# Patient Record
Sex: Male | Born: 1965
Health system: Southern US, Community
[De-identification: ages and names within clinical notes are randomized; demographics above are authoritative.]

## PROBLEM LIST (undated history)

## (undated) DIAGNOSIS — R569 Unspecified convulsions: Secondary | ICD-10-CM

## (undated) DIAGNOSIS — R630 Anorexia: Secondary | ICD-10-CM

## (undated) DIAGNOSIS — I2510 Atherosclerotic heart disease of native coronary artery without angina pectoris: Secondary | ICD-10-CM

## (undated) DIAGNOSIS — I4891 Unspecified atrial fibrillation: Secondary | ICD-10-CM

## (undated) DIAGNOSIS — R000 Tachycardia, unspecified: Secondary | ICD-10-CM

## (undated) DIAGNOSIS — E0590 Thyrotoxicosis, unspecified without thyrotoxic crisis or storm: Secondary | ICD-10-CM

## (undated) DIAGNOSIS — IMO0002 Reserved for concepts with insufficient information to code with codable children: Secondary | ICD-10-CM

## (undated) DIAGNOSIS — R001 Bradycardia, unspecified: Secondary | ICD-10-CM

## (undated) DIAGNOSIS — I4892 Unspecified atrial flutter: Secondary | ICD-10-CM

## (undated) DIAGNOSIS — R002 Palpitations: Secondary | ICD-10-CM

## (undated) DIAGNOSIS — F319 Bipolar disorder, unspecified: Secondary | ICD-10-CM

## (undated) DIAGNOSIS — M549 Dorsalgia, unspecified: Secondary | ICD-10-CM

## (undated) HISTORY — DX: Unspecified atrial fibrillation: I48.91

## (undated) HISTORY — DX: Dorsalgia, unspecified: M54.9

## (undated) HISTORY — DX: Unspecified atrial flutter: I48.92

## (undated) HISTORY — DX: Anorexia: R63.0

## (undated) HISTORY — DX: Bipolar disorder, unspecified: F31.9

## (undated) HISTORY — DX: Thyrotoxicosis, unspecified without thyrotoxic crisis or storm: E05.90

## (undated) HISTORY — DX: Tachycardia, unspecified: R00.0

## (undated) HISTORY — DX: Palpitations: R00.2

## (undated) HISTORY — DX: Reserved for concepts with insufficient information to code with codable children: IMO0002

---

## 2003-10-15 ENCOUNTER — Emergency Department (HOSPITAL_COMMUNITY): Admission: AD | Admit: 2003-10-15 | Discharge: 2003-10-15 | Payer: Self-pay | Admitting: Family Medicine

## 2003-12-03 ENCOUNTER — Emergency Department (HOSPITAL_COMMUNITY): Admission: EM | Admit: 2003-12-03 | Discharge: 2003-12-03 | Payer: Self-pay | Admitting: Emergency Medicine

## 2004-02-20 ENCOUNTER — Emergency Department (HOSPITAL_COMMUNITY): Admission: EM | Admit: 2004-02-20 | Discharge: 2004-02-20 | Payer: Self-pay | Admitting: Family Medicine

## 2004-03-10 ENCOUNTER — Emergency Department (HOSPITAL_COMMUNITY): Admission: EM | Admit: 2004-03-10 | Discharge: 2004-03-10 | Payer: Self-pay | Admitting: Family Medicine

## 2004-12-23 ENCOUNTER — Emergency Department (HOSPITAL_COMMUNITY): Admission: EM | Admit: 2004-12-23 | Discharge: 2004-12-23 | Payer: Self-pay | Admitting: Family Medicine

## 2005-02-14 ENCOUNTER — Emergency Department (HOSPITAL_COMMUNITY): Admission: EM | Admit: 2005-02-14 | Discharge: 2005-02-14 | Payer: Self-pay | Admitting: Family Medicine

## 2005-07-14 ENCOUNTER — Emergency Department (HOSPITAL_COMMUNITY): Admission: EM | Admit: 2005-07-14 | Discharge: 2005-07-14 | Payer: Self-pay | Admitting: Family Medicine

## 2005-07-17 ENCOUNTER — Emergency Department (HOSPITAL_COMMUNITY): Admission: EM | Admit: 2005-07-17 | Discharge: 2005-07-17 | Payer: Self-pay | Admitting: Family Medicine

## 2005-09-15 ENCOUNTER — Emergency Department (HOSPITAL_COMMUNITY): Admission: EM | Admit: 2005-09-15 | Discharge: 2005-09-15 | Payer: Self-pay | Admitting: Emergency Medicine

## 2006-12-30 ENCOUNTER — Emergency Department (HOSPITAL_COMMUNITY): Admission: EM | Admit: 2006-12-30 | Discharge: 2006-12-30 | Payer: Self-pay | Admitting: Emergency Medicine

## 2007-06-05 ENCOUNTER — Emergency Department (HOSPITAL_COMMUNITY): Admission: EM | Admit: 2007-06-05 | Discharge: 2007-06-05 | Payer: Self-pay | Admitting: Emergency Medicine

## 2007-12-24 ENCOUNTER — Emergency Department (HOSPITAL_COMMUNITY): Admission: EM | Admit: 2007-12-24 | Discharge: 2007-12-24 | Payer: Self-pay | Admitting: Emergency Medicine

## 2007-12-28 ENCOUNTER — Emergency Department (HOSPITAL_COMMUNITY): Admission: EM | Admit: 2007-12-28 | Discharge: 2007-12-28 | Payer: Self-pay | Admitting: Emergency Medicine

## 2008-01-02 ENCOUNTER — Emergency Department (HOSPITAL_COMMUNITY): Admission: EM | Admit: 2008-01-02 | Discharge: 2008-01-02 | Payer: Self-pay | Admitting: Emergency Medicine

## 2008-02-01 ENCOUNTER — Emergency Department (HOSPITAL_COMMUNITY): Admission: EM | Admit: 2008-02-01 | Discharge: 2008-02-01 | Payer: Self-pay | Admitting: Emergency Medicine

## 2008-02-22 ENCOUNTER — Telehealth: Payer: Self-pay | Admitting: *Deleted

## 2008-02-26 ENCOUNTER — Ambulatory Visit: Payer: Self-pay | Admitting: Sports Medicine

## 2008-03-11 ENCOUNTER — Telehealth: Payer: Self-pay | Admitting: *Deleted

## 2008-03-13 ENCOUNTER — Ambulatory Visit: Payer: Self-pay | Admitting: Family Medicine

## 2008-04-07 ENCOUNTER — Telehealth: Payer: Self-pay | Admitting: Family Medicine

## 2008-04-29 ENCOUNTER — Telehealth: Payer: Self-pay | Admitting: *Deleted

## 2008-05-07 ENCOUNTER — Encounter (INDEPENDENT_AMBULATORY_CARE_PROVIDER_SITE_OTHER): Payer: Self-pay | Admitting: *Deleted

## 2008-09-25 ENCOUNTER — Emergency Department (HOSPITAL_COMMUNITY): Admission: EM | Admit: 2008-09-25 | Discharge: 2008-09-25 | Payer: Self-pay | Admitting: Emergency Medicine

## 2009-05-29 ENCOUNTER — Telehealth: Payer: Self-pay | Admitting: Family Medicine

## 2009-05-29 ENCOUNTER — Ambulatory Visit: Payer: Self-pay | Admitting: Family Medicine

## 2009-05-30 ENCOUNTER — Encounter (INDEPENDENT_AMBULATORY_CARE_PROVIDER_SITE_OTHER): Payer: Self-pay | Admitting: *Deleted

## 2009-05-30 DIAGNOSIS — F17200 Nicotine dependence, unspecified, uncomplicated: Secondary | ICD-10-CM

## 2009-06-01 ENCOUNTER — Telehealth: Payer: Self-pay | Admitting: *Deleted

## 2009-06-18 ENCOUNTER — Telehealth: Payer: Self-pay | Admitting: Family Medicine

## 2009-11-02 ENCOUNTER — Ambulatory Visit: Payer: Self-pay | Admitting: Family Medicine

## 2009-11-02 ENCOUNTER — Encounter: Payer: Self-pay | Admitting: Family Medicine

## 2009-11-02 DIAGNOSIS — I252 Old myocardial infarction: Secondary | ICD-10-CM

## 2009-11-04 ENCOUNTER — Telehealth: Payer: Self-pay | Admitting: Family Medicine

## 2010-09-11 ENCOUNTER — Emergency Department (HOSPITAL_COMMUNITY)
Admission: EM | Admit: 2010-09-11 | Discharge: 2010-09-11 | Payer: Self-pay | Source: Home / Self Care | Admitting: Emergency Medicine

## 2010-09-11 ENCOUNTER — Emergency Department (HOSPITAL_COMMUNITY)
Admission: EM | Admit: 2010-09-11 | Discharge: 2010-09-11 | Payer: Self-pay | Source: Home / Self Care | Admitting: Family Medicine

## 2010-09-27 ENCOUNTER — Encounter: Payer: Self-pay | Admitting: Family Medicine

## 2010-09-29 ENCOUNTER — Ambulatory Visit: Payer: Self-pay | Admitting: Cardiovascular Disease

## 2010-09-30 ENCOUNTER — Encounter: Payer: Self-pay | Admitting: Family Medicine

## 2010-09-30 ENCOUNTER — Ambulatory Visit: Admission: RE | Admit: 2010-09-30 | Discharge: 2010-09-30 | Payer: Self-pay | Source: Home / Self Care

## 2010-09-30 DIAGNOSIS — F319 Bipolar disorder, unspecified: Secondary | ICD-10-CM | POA: Insufficient documentation

## 2010-09-30 DIAGNOSIS — E0590 Thyrotoxicosis, unspecified without thyrotoxic crisis or storm: Secondary | ICD-10-CM | POA: Insufficient documentation

## 2010-09-30 LAB — CONVERTED CEMR LAB: Free T4: 3.38 ng/dL — ABNORMAL HIGH (ref 0.80–1.80)

## 2010-10-01 ENCOUNTER — Telehealth: Payer: Self-pay | Admitting: Family Medicine

## 2010-10-04 ENCOUNTER — Encounter: Payer: Self-pay | Admitting: *Deleted

## 2010-10-08 ENCOUNTER — Telehealth: Payer: Self-pay | Admitting: Family Medicine

## 2010-10-12 NOTE — Assessment & Plan Note (Signed)
Summary: ?flu,tcb   Vital Signs:  Patient profile:   45 year old male Weight:      150 pounds Temp:     98.4 degrees F oral Pulse rate:   51 / minute BP sitting:   124 / 75  (left arm) Cuff size:   regular  Vitals Entered By: Tessie Fass CMA (November 02, 2009 10:23 AM) CC: flu symptoms Is Patient Diabetic? No Pain Assessment Patient in pain? yes     Location: abdomen, sides and low back Intensity: 10   Primary Care Provider:  Eustaquio Boyden  MD  CC:  flu symptoms.  History of Present Illness: CC: ? flu  1 wk h/o fatigue, myalgias, body aches (sides hurting, back and legs, shoulders), out of work all of last week.  Drinking as much as he can.  Saturday went into work, but still had trouble.  Took sister's vicodin and that really helped.  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  + thick mucous discharge.  + ST, HA, worse with bending down.  Temp saturday of 103.  Tried theraflu (acetaminophen), ibuprofen, tylenol #3, BC goody powder.  + vomiting.  No diarrhea or constipation or abd pain.  no dysuria.   Habits & Providers  Alcohol-Tobacco-Diet     Tobacco Status: quit     Cigarette Packs/Day: <0.25  Current Medications (verified): 1)  Cheratussin Ac 100-10 Mg/78ml Syrp (Guaifenesin-Codeine) .Marland Kitchen.. 1 Teaspoon Three Times Daily As Needed For Cough  Allergies (verified): 1)  ! Pcn  Past History:  Past Medical History: per patient MI 110 - no records of this in [place]. h/o bradycardia.  Social History: Smoking Status:  quit Packs/Day:  <0.25  Physical Exam  General:  Well-developed,well-nourished,in no acute distress; alert,appropriate and cooperative throughout examination. vitals reviewed. Head:  normocephalic and atraumatic Eyes:  No corneal or conjunctival inflammation noted. EOMI. Perrla. Ears:  External ear exam shows no significant lesions or deformities.  Otoscopic examination reveals clear canals, tympanic membranes are intact  bilaterally without bulging, retraction, inflammation or discharge. Hearing is grossly normal bilaterally.  + fluid behind bilateral ear drums Nose:  External nasal examination shows no deformity or inflammation. Nasal mucosa are pink and moist without lesions or exudates. Mouth:  Oral mucosa and oropharynx without lesions or exudates.  Teeth in good repair. Neck:  No deformities, masses, or tenderness noted. Lungs:  Normal respiratory effort, chest expands symmetrically. Lungs are clear to auscultation, no crackles or wheezes.  Heart:  Normal rate and regular rhythm. S1 and S2 normal without gallop, murmur, click, rub or other extra sounds. Abdomen:  Bowel sounds positive,abdomen soft and non-tender without masses, organomegaly or hernias noted. Msk:  No deformity or scoliosis noted of thoracic or lumbar spine.  tender to palpation of muscles throughout Extremities:  No clubbing, cyanosis, edema, or deformity noted with normal full range of motion of all joints.   Skin:  Intact without suspicious lesions or rashes Cervical Nodes:  R anterior LN tender.     Impression & Recommendations:  Problem # 1:  INFLUENZA LIKE ILLNESS (ICD-487.1)  Today day #8 of illness.  Rest, increase fluids, use Tylenol [phone] mg every 4-6 hours, and avoid contact with others. call office if no improvement in 5-7 days or if symptoms worsen.  Provided with cough syrup and letter for work excuse from Monday to Wednesday of this week.  RTC if red flags.  tramadol for myalgias.  Orders: [facility]- Est Level  3 ([address])  Complete Medication  List: 1)  Cheratussin Ac 100-10 Mg/70ml Syrp (Guaifenesin-codeine) .Marland Kitchen.. 1 teaspoon three times daily as needed for cough 2)  Tramadol Hcl 50 Mg Tabs (Tramadol hcl) .... Take one by mouth two times a day as needed muscle pain.   Patient Instructions: 1)  You may well have had the flu. 2)  I would expect you to get better by the middle of this week. 3)  Continue with rest and staying  well hydrated as up to now. 4)  Please return if your fever returns (101.5), or not improving as expected, or worsening abdominal pain. 5)  Pleasure to see you today. Prescriptions: TRAMADOL HCL 50 MG TABS (TRAMADOL HCL) take one by mouth two times a day as needed muscle pain.  #25 x 0   Entered and Authorized by:   Eustaquio Boyden  MD   Signed by:   Eustaquio Boyden  MD on 11/02/2009   Method used:   Electronically to        [facility] [phone]* (retail)       [address]       [place], [place]  [address]       Ph: [phone]       Fax: [phone]   RxID:   [id number] CHERATUSSIN AC 100-10 MG/5ML SYRP (GUAIFENESIN-CODEINE) 1 teaspoon three times daily as needed for cough  #100 x 0   Entered and Authorized by:   Eustaquio Boyden  MD   Signed by:   Eustaquio Boyden  MD on 11/02/2009   Method used:   Print then Give to Patient   RxID:   [id number]

## 2010-10-12 NOTE — Letter (Signed)
Summary: Out of Work  [facility]  [address]   [place], [place] [address]   Phone: [phone]  Fax: [phone]    November 02, 2009   Employee:  Kenneth Jimenez    To Whom It May Concern:   For Medical reasons, please excuse the above named employee from work for the following dates:  Start:  November 02, 2009   End:  November 04, 2009   If you need additional information, please feel free to contact our office.         Sincerely,    Eustaquio Boyden  MD

## 2010-10-12 NOTE — Progress Notes (Signed)
Summary: triage  Phone Note Call from Patient Call back at [phone]   Caller: Patient Summary of Call: coughing up yellowish mucus and when he swollows his ear hurts Initial call taken by: De Nurse,  November 04, 2009 8:45 AM  Follow-up for Phone Call        LM Follow-up by: Golden Circle RN,  November 04, 2009 8:47 AM  Additional Follow-up for Phone Call Additional follow up Details #1::        LM Additional Follow-up by: Golden Circle RN,  November 04, 2009 9:34 AM    Additional Follow-up for Phone Call Additional follow up Details #2::    offered him a work in with wait. he does not want to wait but has no transportation anyway. he will call back when he can find a ride. states the meds he is taking is not helping. Follow-up by: Golden Circle RN,  November 04, 2009 9:53 AM  Additional Follow-up for Phone Call Additional follow up Details #3:: Details for Additional Follow-up Action Taken: "the [employer] customer is unavailable"  will wait for him to cal back & will then offer an appt Additional Follow-up by: Golden Circle RN,  November 05, 2009 10:22 AM  New/Updated Medications: BROMFED DM 30-2-10 MG/5ML SYRP (PSEUDOEPH-BROMPHEN-DM) take one teaspoon q 4-6hours as needed cough. Prescriptions: BROMFED DM 30-2-10 MG/5ML SYRP (PSEUDOEPH-BROMPHEN-DM) take one teaspoon q 4-6hours as needed cough.  #50cc x 0   Entered by:   Golden Circle RN   Authorized by:   Eustaquio Boyden  MD   Signed by:   Golden Circle RN on 11/05/2009   Method used:   Electronically to        [facility] [phone]* (retail)       [address]       [place], [place]  [address]       Ph: [phone]       Fax: [phone]   RxID:   [id number] BROMFED DM 30-2-10 MG/5ML SYRP (PSEUDOEPH-BROMPHEN-DM) take one teaspoon q 4-6hours as needed cough.  #50cc x 0   Entered and Authorized by:   Eustaquio Boyden  MD   Signed by:   Eustaquio Boyden  MD on 11/05/2009   Method used:    Electronically to        [facility] [address] [phone]* (retail)       [address]       [place], [place]  [address]       Ph: [phone]       Fax: [phone]   RxID:   [id number]  thanks. that med previously worked well for cough.  I guess no longer working.  Did have fluid behind bilateral TMs, will precribe decongestant for him.  If that is not helping either, agree to come in to be seen.  Let him know this.  Tried to call [phone] and I think I left a message but no beep so unsure.  Advise if uses this decongestant, not to use other cough medicine prescribed.  thanks.  Eustaquio Boyden  MD  November 05, 2009 12:03 PM   called & informed him of above. he wants it sent to [facility] [address]. I changed it & sent.told him to call if this does not help. we will see him if no better.Golden Circle RN  November 05, 2009 1:58 PM

## 2010-10-12 NOTE — Progress Notes (Signed)
Summary: Ear pain.   Phone Note Call from Patient   Summary of Call: Right ear pain with swallowing. Seen in clinic on Monday with URI signs. Advised to come in to [facility] for Work In appointment. for evaluation.  Initial call taken by: Bobby Rumpf  MD,  November 04, 2009 7:33 AM

## 2010-10-14 NOTE — Assessment & Plan Note (Signed)
Summary: elev thyroid,df   Vital Signs:  Patient profile:   45 year old male Height:      68.75 inches Weight:      136 pounds BMI:     20.30 Pulse rate:   92 / minute BP sitting:   118 / 76  (right arm)  Vitals Entered By: Arlyss Repress CMA, (September 30, 2010 3:12 PM) CC: f/up Dr.Nahser. had blood work done on Monday. Metoprolol 25mg  2 pills twice a day. Is Patient Diabetic? No Pain Assessment Patient in pain? no        Primary Care Provider:  Eustaquio Boyden  MD  CC:  f/up Dr.Nahser. had blood work done on Monday. Metoprolol 25mg  2 pills twice a day.Marland Kitchen  History of Present Illness: Kenneth Jimenez presents to clinic today as per referral from cardiology. He was seen at [facility] for tachycardia. Upon evaluation it was noted that he had sinus tach. He was treated with beta blockers. Lab findings showed a low TSH of 0.006. He comes to clinic today to re-establish care and get treatment for his thyorid disorder.   In the last few months Kenneth Housey notes tachycardia, heat intolerance, difficulty sleeping, jitteryness, and dry mouth, and weight loss. He stoped taking his seroquel after the ED visit.  His HR is much better controlled with 50mg  of metop twice daily.   Habits & Providers  Alcohol-Tobacco-Diet     Tobacco Status: current     Tobacco Counseling: to quit use of tobacco products     Cigarette Packs/Day: <0.25  Comments: trying to quitt  Current Problems (verified): 1)  Bipolar Disorder Unspecified  (ICD-296.80) 2)  Hyperthyroidism  (ICD-242.90) 3)  Myocardial Infarction, Hx of  (ICD-412) 4)  Tobacco User  (ICD-305.1)  Current Medications (verified): 1)  Propylthiouracil 50 Mg Tabs (Propylthiouracil) .... 2 Tabs By Mouth Tid 2)  Aspirin 81 Mg Tbec (Aspirin) 3)  Metoprolol Tartrate 50 Mg Tabs (Metoprolol Tartrate) .Marland Kitchen.. 1 By Mouth Bid  Allergies (verified): 1)  ! Pcn  Past History:  Social History: Last updated: 05/29/2009 twin daughters Eliot [place], [place] [employer] 2006) with  developmental concerns.  smoker.  unemployed  Past Medical History: per patient MI 22 - no records of this in [place]. Hyperthyroid Bipolar disorder treatment at [facility].   Social History: Smoking Status:  current  Review of Systems       The patient complains of weight loss and muscle weakness.  The patient denies anorexia, fever, chest pain, syncope, prolonged cough, abdominal pain, severe indigestion/heartburn, suspicious skin lesions, difficulty walking, and enlarged lymph nodes.    Physical Exam  General:  Anxious thin male in NAD. VS noted.  Eyes:  No corneal or conjunctival inflammation noted. EOMI. Perrla. No proptosis Ears:  no external deformities.   Nose:  no external deformity.   Mouth:  Oral mucosa and oropharynx without lesions or exudates.  Teeth in good repair. MMM Neck:  Enlarged non-tender and smothe thyroid Lungs:  Normal respiratory effort, chest expands symmetrically. Lungs are clear to auscultation, no crackles or wheezes.  Heart:  Normal rate and regular rhythm. S1 and S2 normal without gallop, murmur, click, rub or other extra sounds. Abdomen:  Bowel sounds positive,abdomen soft and non-tender without masses, organomegaly or hernias noted. Extremities:  Non edemetus BL LE Neurologic:  Some tremor noted on full arm extension BL.  otherwise Neuro exam is normal.  Skin:  Intact without suspicious lesions or rashes Psych:  Oriented X3, memory intact for recent and remote, normally interactive,  good eye contact, not depressed appearing, not agitated, not suicidal, not homicidal, and slightly anxious.     Impression & Recommendations:  Problem # 1:  HYPERTHYROIDISM (ICD-242.90) Assessment New Think Dx of hyperthyroid based on symptoms, exam and low TSH.  Uncertain of the etiology currently. He does not appear to have a nodule nor a tender thyroid. Pt does not have health insurance currently, however he is working on getting the [facility] card.    Plan: Start with PTU at 100 mg three times a day.  Will also obtain T4 and f/u in 2 weeks. Red flags reviwed.    His updated medication list for this problem includes:    Propylthiouracil 50 Mg Tabs (Propylthiouracil) .Marland Kitchen... 2 tabs by mouth tid    Metoprolol Tartrate 50 Mg Tabs (Metoprolol tartrate) .Marland Kitchen... 1 by mouth bid  Orders: Free T4-[facility] ([address]) [facility]- Est Level  3 ([address])  Problem # 2:  BIPOLAR DISORDER UNSPECIFIED (ICD-296.80) Assessment: Unchanged Advised to restart seroquel.  Complete Medication List: 1)  Propylthiouracil 50 Mg Tabs (Propylthiouracil) .... 2 tabs by mouth tid 2)  Aspirin 81 Mg Tbec (Aspirin) 3)  Metoprolol Tartrate 50 Mg Tabs (Metoprolol tartrate) .Marland Kitchen.. 1 by mouth bid  Patient Instructions: 1)  Thank you for seeing me today. 2)  [facility] and get those medications.  3)  Take the PTU 2 pills three times a day.  4)  Let me know if it is really expensive.  5)  Come back in 2 weeks. 6)  Make sure you get that [facility] Card.  7)  If you have chest pain, difficulty breathing, fevers over 102 that does not get better with tylenol please call us or see a doctor.  Prescriptions: PROPYLTHIOURACIL 50 MG TABS (PROPYLTHIOURACIL) 2 tabs by mouth tid  #180 x 2   Entered and Authorized by:   Clementeen Graham MD   Signed by:   Clementeen Graham MD on 09/30/2010   Method used:   Electronically to        [facility] [phone]* (retail)       [address]       [place], [place]  [address]       Ph: [phone]       Fax: [phone]   RxID:   [id number]    Orders Added: 1)  Free T4-[facility] [[address]] 2)  [facility]- Est Level  3 [[address]]

## 2010-10-14 NOTE — Progress Notes (Signed)
Summary: Phn Msg  Phone Note Call from Patient Call back at Home Phone [phone]   Reason for Call: Talk to Nurse Summary of Call: asking to speak with Larita Fife or Kennon Rounds, would not give details pt stated "[place] would not allow him to give details to anyone other than RN or MD".  Initial call taken by: Knox Royalty,  October 08, 2010 4:19 PM  Follow-up for Phone Call        message left on voicemail to return call. Follow-up by: Theresia Lo RN,  October 08, 2010 4:28 PM  Additional Follow-up for Phone Call Additional follow up Details #1::        patient calling wanting to make sure we recived the fax back from [facility] regarding the money for his meds. advised that we did. he states the medicine leaves a bad taste in his mouth for 20-30 minutes ,but he is feeling better. advised will send message to MD. Additional Follow-up by: Theresia Lo RN,  October 08, 2010 5:01 PM    Additional Follow-up for Phone Call Additional follow up Details #2::    Randie Heinz will follow up with patient.  Follow-up by: Clementeen Graham MD,  October 09, 2010 8:55 AM

## 2010-10-14 NOTE — Consult Note (Signed)
Summary: [facility]   Imported By: Knox Royalty 10/07/2010 11:50:16  _____________________________________________________________________  External Attachment:    Type:   Image     Comment:   External Document

## 2010-10-14 NOTE — Progress Notes (Signed)
Summary: Req alternate Rx  Phone Note Call from Patient Call back at Home Phone [phone]   Summary of Call: medication prescribed cost $30, pt cant afford & needs something else asap Initial call taken by: Knox Royalty,  October 01, 2010 9:17 AM  Follow-up for Phone Call        Kenneth Jimenez calling back and want to know if anyone was going to call in the alternate RX for him.  His thyroid levels are extremely high so he need asap Follow-up by: Abundio Miu,  October 01, 2010 9:29 AM  Additional Follow-up for Phone Call Additional follow up Details #1::        paged Dr. Denyse Amass . waiting call back. Additional Follow-up by: Theresia Lo RN,  October 01, 2010 11:24 AM    Additional Follow-up for Phone Call Additional follow up Details #2::    consulted with Dr. Perley Jain  and he advises this will be ok to wait until Dr. Denyse Amass can address.. patient is on metoprolol. patient notified and he is agreeable with this plan. Pharmacy is [facility] [address] Follow-up by: Theresia Lo RN,  October 01, 2010 11:56 AM  Additional Follow-up for Phone Call Additional follow up Details #3:: Details for Additional Follow-up Action Taken: pt checking status Additional Follow-up by: Knox Royalty,  October 04, 2010 10:26 AM  left message for pt to call me back.Marland KitchenMarland KitchenGolden Circle RN  October 04, 2010 1:38 PM told him we can pay for the first month's med. he will be here in am to get the money. told him we cannot help after this due to limited funds. advised going to pharmacy at [facility]pt to fill out forms from the drug manufac. to get free meds. he agreed. letter & money to L. Katrinka Blazing, RN as I will not be here tomorrow.Golden Circle RN  October 04, 2010 1:46 PM

## 2010-10-14 NOTE — Letter (Signed)
Summary: Generic Letter  [facility]  [address]   [place], [place] [address]   Phone: [phone]  Fax: [phone]    10/04/2010  [name] [address] [place], [place]  [address]    Dear Kenneth Jimenez,  We have given this patient $30 cash to pay for his new prescription. When he picks it up, please sign this letter & fax it back to [place] at [phone]. Indicate the exact cost. This is how we keep track of expenditures.       Sincerely,   Golden Circle RN

## 2010-10-15 ENCOUNTER — Ambulatory Visit: Payer: Self-pay | Admitting: Family Medicine

## 2010-10-15 ENCOUNTER — Ambulatory Visit (INDEPENDENT_AMBULATORY_CARE_PROVIDER_SITE_OTHER): Payer: Self-pay | Admitting: Family Medicine

## 2010-10-15 ENCOUNTER — Encounter: Payer: Self-pay | Admitting: Family Medicine

## 2010-10-15 ENCOUNTER — Ambulatory Visit: Admit: 2010-10-15 | Payer: Self-pay

## 2010-10-15 DIAGNOSIS — E0590 Thyrotoxicosis, unspecified without thyrotoxic crisis or storm: Secondary | ICD-10-CM

## 2010-10-15 DIAGNOSIS — I252 Old myocardial infarction: Secondary | ICD-10-CM

## 2010-10-20 NOTE — Assessment & Plan Note (Signed)
Summary: PT HAD AN APPT W/PROVIDER THIS PM/NEEDED TO BE SEEN SOONER DU...   Vital Signs:  Patient profile:   45 year old male Height:      68.75 inches Weight:      136 pounds BMI:     20.30 Pulse rate:   72 / minute BP sitting:   123 / 75  (right arm) Cuff size:   regular  Vitals Entered By: Tessie Fass CMA (October 15, 2010 10:04 AM) Is Patient Diabetic? No Pain Assessment Patient in pain? no        Primary Provider:  Clementeen Graham MD   History of Present Illness: 45 yo w/ newly dx'ed hyperthyroid comes for appt-- was supposed to be seen in the afternoon by PCP but was anxious and not feeling well and wanted an earlier appt.  1. Bad taste in his mouth: After he takes his medicines, he has a "nasty" taste in his mouth for 15-30 minutes.  Feels like it's in his saliva, not from burping.  Takes medicine with water or soda, often after a meal.  Is present even if he eats with his med or if he chews gum after.  Does not bother him enough to not take his medicine, just annoying and he would like to know if it's normal and if there is anything he/we can do about it.  2. Hyperhtyroid: Pt understands it is very important to stay on his medicine.  Feels like he is starting to have some increased energy, but still feeling "jittery" and shakey.  Occ. palpitations, most recently yesterday evening; per pt, cards said that he will always have these. No CP, SOB or dizziness associated w/ these episodes. Sleeping ok, but still concerned about loosing weight. Lost  ~2lb since last visit despite trying to eat a lot.  Still walking also and wife questioned him as to whether the walking + thyroid may be why he is loosing weight.  Told him to continue the walking, and that the medicine can take a month or two before it starts making his thyroid come back down toward normal, so to give [place] a few months before we get too concered that he is still not gaining weight.  Weight loss seems to be very upsetting to  him, would really like to start putting weight back on.  Current Problems (verified): 1)  Bipolar Disorder Unspecified  (ICD-296.80) 2)  Hyperthyroidism  (ICD-242.90) 3)  Myocardial Infarction, Hx of  (ICD-412) 4)  Tobacco User  (ICD-305.1)  Current Medications (verified): 1)  Propylthiouracil 50 Mg Tabs (Propylthiouracil) .... 2 Tabs By Mouth Tid 2)  Aspirin 81 Mg Tbec (Aspirin) 3)  Metoprolol Tartrate 50 Mg Tabs (Metoprolol Tartrate) .Marland Kitchen.. 1 By Mouth Bid  Allergies (verified): 1)  ! Pcn  Review of Systems       The patient complains of weight loss.  The patient denies fever, hoarseness, chest pain, syncope, and dyspnea on exertion.    Physical Exam  General:  alert, well-developed, well-nourished, well-hydrated, and cooperative to examination.   Eyes:  vision grossly intact, pupils equal, pupils round, pupils reactive to light, and pupils react to accomodation.   Mouth:  pharynx pink and moist, no erythema, and no exudates.   Neck:  supple, no masses, no thyromegaly, no thyroid nodules or tenderness, and no neck tenderness.   Heart:  normal rate, regular rhythm, no murmur, and no gallop.   Psych:  good eye contact, slightly anxious, and hyperactive.  Impression & Recommendations:  Problem # 1:  HYPERTHYROIDISM (ICD-242.90)  Heart rate controlled-- 72, regular, no CP or SOB. Pt feeling occ palpitations but was told my cardiologist that this would never completely go away.  Pulses good.  RTC and recheck TSH in 4weeks for medication dosing adjustments.  PCP should be aware that pt will likely mention the medication leaving a bad taste in mouth.  Would re-emphasize that he can take it with food or any kind of drink he would like.  Cont metoprolol and PTU.   His updated medication list for this problem includes:    Propylthiouracil 50 Mg Tabs (Propylthiouracil) .Marland Kitchen... 2 tabs by mouth tid    Metoprolol Tartrate 50 Mg Tabs (Metoprolol tartrate) .Marland Kitchen... 1 by mouth bid  Orders: [facility]-  Est Level  3 ([address])  Problem # 2:  MYOCARDIAL INFARCTION, HX OF (ICD-412) No CP, diaphoresis, arm numbness/tingling, jaw pain, or dizziness associated w/ palpitations. BP and HR WNL. Cont metoprolol and ASA.   His updated medication list for this problem includes:    Aspirin 81 Mg Tbec (Aspirin)    Metoprolol Tartrate 50 Mg Tabs (Metoprolol tartrate) .Marland Kitchen... 1 by mouth bid  Orders: [facility]- Est Level  3 ([address])  Complete Medication List: 1)  Propylthiouracil 50 Mg Tabs (Propylthiouracil) .... 2 tabs by mouth tid 2)  Aspirin 81 Mg Tbec (Aspirin) 3)  Metoprolol Tartrate 50 Mg Tabs (Metoprolol tartrate) .Marland Kitchen.. 1 by mouth bid   Orders Added: 1)  [facility]- Est Level  3 [[address]]

## 2010-10-25 ENCOUNTER — Telehealth: Payer: Self-pay | Admitting: Family Medicine

## 2010-10-25 NOTE — Telephone Encounter (Signed)
Spoke with Dr. Denyse Amass.  He says pt should continue taking rhe Metoprolol but stop the thyroid med.  Also wants him to be seen tomorrow if possible.  Will route this note to the schedulers to see if they can work him in tomorrow with the [place] doctor.

## 2010-10-25 NOTE — Telephone Encounter (Signed)
Pt c/o intense hand itching after taking his meds this am.  The inching has stopped but he's afraid to take the pills again.  Was started on two new meds last week; Metoprolol and something for his thyroid.  Told him not to take any more until I had a chance to talk with Dr. Denyse Amass.  No c/o difficulty breathing with this reaction.

## 2010-10-26 ENCOUNTER — Ambulatory Visit: Payer: Self-pay

## 2010-10-26 NOTE — Telephone Encounter (Signed)
Pt was scheduled on 10/26/10 at 9:30, did not show.

## 2010-10-27 ENCOUNTER — Ambulatory Visit: Payer: Self-pay | Admitting: Cardiovascular Disease

## 2010-10-29 ENCOUNTER — Ambulatory Visit: Payer: Self-pay | Admitting: Family Medicine

## 2010-11-03 ENCOUNTER — Ambulatory Visit: Payer: Self-pay | Admitting: Cardiovascular Disease

## 2010-11-22 LAB — VALPROIC ACID LEVEL: Valproic Acid Lvl: <10 ug/mL — ABNORMAL LOW (ref 50.0–100.0)

## 2010-11-22 LAB — POCT I-STAT, CHEM 8
BUN: 14 mg/dL (ref 6–23)
HCT: 42 % (ref 39.0–52.0)
Hemoglobin: 14.3 g/dL (ref 13.0–17.0)
Potassium: 4.4 mEq/L (ref 3.5–5.1)

## 2010-11-22 LAB — POCT CARDIAC MARKERS
Myoglobin, poc: 65.2 ng/mL (ref 12–200)
Troponin i, poc: <0.05 ng/mL (ref 0.00–0.09)

## 2010-11-23 ENCOUNTER — Telehealth: Payer: Self-pay | Admitting: Family Medicine

## 2010-11-23 NOTE — Telephone Encounter (Signed)
Pt asking to speak with Larita Fife re: his medication & something else (did not want to give details)

## 2010-11-23 NOTE — Telephone Encounter (Signed)
Patient received funds from our patient assistance fund 10/04/2010 for his medication, PTU. He has applied for medicaid and has been approved but it has not started yet and he has been without his medicine for a week now. Marland Kitchen He is asking for assistance again.  Advised patient that we are only able to provide assistance once in 6 months. He has appointment with MD tomorrow and will discuss tomorrow.Marland Kitchen

## 2010-11-24 ENCOUNTER — Ambulatory Visit (INDEPENDENT_AMBULATORY_CARE_PROVIDER_SITE_OTHER): Payer: Self-pay | Admitting: Family Medicine

## 2010-11-24 ENCOUNTER — Encounter: Payer: Self-pay | Admitting: Family Medicine

## 2010-11-24 ENCOUNTER — Other Ambulatory Visit: Payer: Self-pay | Admitting: Family Medicine

## 2010-11-24 VITALS — BP 124/75 | HR 67 | Temp 98.7°F | Ht 69.0 in | Wt 139.4 lb

## 2010-11-24 DIAGNOSIS — E0590 Thyrotoxicosis, unspecified without thyrotoxic crisis or storm: Secondary | ICD-10-CM

## 2010-11-24 LAB — T4, FREE: Free T4: 1.25 ng/dL (ref 0.80–1.80)

## 2010-11-24 LAB — CONVERTED CEMR LAB: Free T4: 1.25 ng/dL (ref 0.80–1.80)

## 2010-11-24 MED ORDER — PROPYLTHIOURACIL 50 MG PO TABS: 100.0000 mg | ORAL_TABLET | Freq: Three times a day (TID) | ORAL | Status: DC

## 2010-11-24 NOTE — Progress Notes (Signed)
Kenneth Jimenez presents to clinic today for a follow up of his hyperthyroidism.   Thyroid: Has not been taking his PTU in the last 3 weeks as he cannot afford to refill this medication. He has only $15 to his name currently. [facility].  He continues to take his beta blocker. He denies any weakness or heart racing.  He understands the importance of taking this medication.  He currently is in process of being provided for medicaid.   ROS: See HPI. Otherwise has gained 3 pounds and denies any fevers or chills. Feels well otherwise.   Exam:  Vs noted.  Gen: Well NAD HEENT: EOMI, PERRL, MMM.  No thyroid mass noted on palpitation.  Lungs: CTABL Nl WOB Heart: RRR no MRG Abd: NABS, NT, ND Exts: Non edematous BL  LE

## 2010-11-24 NOTE — Assessment & Plan Note (Signed)
On PTU awaiting medicaid to get a re-uptake scan done.  Plan to continue PTU in the time being. Trying to get this medication filled at [facility] outpatient pharmacy where it will be cheaper. This is the cheapest medication like it. Will retest TSH, T4, T3 today and adjust the dose as required. Will follow.

## 2010-11-24 NOTE — Patient Instructions (Signed)
Thank you for coming in today. Come back in 1 month.  Take your PTU three times a day.  Try taking it to the outpatient pharmacy.   To the pharmacist: Mr Zoll has only $15.  He cannot afford his life sustaining PTU for hyperthyroidism. Please allow him to use the outpatient pharmacy. Call me directly at [phone] for questions.   Thanks, Clayburn Pert

## 2010-11-26 ENCOUNTER — Ambulatory Visit: Payer: Self-pay | Admitting: Cardiovascular Disease

## 2010-12-27 ENCOUNTER — Ambulatory Visit: Payer: Self-pay | Admitting: Family Medicine

## 2010-12-30 ENCOUNTER — Telehealth: Payer: Self-pay | Admitting: Family Medicine

## 2010-12-30 NOTE — Telephone Encounter (Signed)
Pt having problems with his allergies, not sure what to take over the counter that can be mixed with his current meds.

## 2010-12-30 NOTE — Telephone Encounter (Signed)
Forward to Corey 

## 2010-12-31 ENCOUNTER — Ambulatory Visit (INDEPENDENT_AMBULATORY_CARE_PROVIDER_SITE_OTHER): Payer: Self-pay | Admitting: Family Medicine

## 2010-12-31 ENCOUNTER — Encounter: Payer: Self-pay | Admitting: Family Medicine

## 2010-12-31 VITALS — BP 112/76 | HR 74 | Temp 98.2°F | Ht 68.75 in | Wt 142.7 lb

## 2010-12-31 DIAGNOSIS — H109 Unspecified conjunctivitis: Secondary | ICD-10-CM

## 2010-12-31 DIAGNOSIS — J309 Allergic rhinitis, unspecified: Secondary | ICD-10-CM

## 2010-12-31 DIAGNOSIS — J302 Other seasonal allergic rhinitis: Secondary | ICD-10-CM

## 2010-12-31 DIAGNOSIS — E0590 Thyrotoxicosis, unspecified without thyrotoxic crisis or storm: Secondary | ICD-10-CM

## 2010-12-31 MED ORDER — POLYMYXIN B-TRIMETHOPRIM 10000-0.1 UNIT/ML-% OP SOLN: 1.0000 [drp] | OPHTHALMIC | Status: AC

## 2010-12-31 NOTE — Assessment & Plan Note (Signed)
Reviewed thyroid labs, continue PTU.  Follow up with Dr. Denyse Amass within the next month, to decide on when to do thyroid scan.  Has orange card now.

## 2010-12-31 NOTE — Progress Notes (Signed)
  Subjective:    Patient ID: Kenneth Jimenez, male    DOB: 1966-02-03, 45 y.o.   MRN: [id number]  HPI Patient here with R eye irritation.  Has been ongoing for a couple of days.  Eye is red and itchy and was matted shut this am when he woke up.  Has had increased discharge from eye, describes as clear-yellow.  Has had seasonal allergies asking what he can take for this.  Has scratchy throat along with frequent sneezing and congestion.  Denies headache, difficulty swallowing, nausea, vomiting, sinus pain.    Would also like to go over last thyroid labs, states no one informed him of results and future plan.  Denies further weight loss, palpitations, heat/cold intolerance, diarrhea, anxiety.    Review of Systems See HPI    Objective:   Physical Exam  HENT:  Nose: Mucosal edema present. Right sinus exhibits no maxillary sinus tenderness and no frontal sinus tenderness. Left sinus exhibits no maxillary sinus tenderness and no frontal sinus tenderness.  Mouth/Throat: Uvula is midline and oropharynx is clear and moist.  Eyes: EOM are normal. Pupils are equal, round, and reactive to light. Right eye exhibits discharge. Right eye exhibits no chemosis, no exudate and no hordeolum. Left eye exhibits no chemosis, no discharge and no exudate. Right conjunctiva is injected. Left conjunctiva is not injected.  Lymphadenopathy:       Head (right side): No submental, no submandibular, no tonsillar, no preauricular and no posterior auricular adenopathy present.       Head (left side): No submental, no submandibular, no tonsillar, no preauricular and no posterior auricular adenopathy present.    He has no cervical adenopathy.          Assessment & Plan:

## 2010-12-31 NOTE — Assessment & Plan Note (Signed)
Symptoms of sneezing, scratchy throat, and congestion consistent with seasonal allergies.  Told him he could try OTC antihistamines.  Told loratadine probably cheapest option of the ones that are less sedating.

## 2010-12-31 NOTE — Assessment & Plan Note (Signed)
R eye with clear-yellow discharge and injection.  Vision normal.  Will treat w/ polytrim drops x10 days

## 2010-12-31 NOTE — Patient Instructions (Addendum)
Thanks for coming to see me today!  I have sent over a prescription for an eye drop for you to your pharmacy.  For your allergies you can buy a medication over the counter called loratidine (claritin).  I would like for you to see Dr. Denyse Amass on the regular schedule that you have been seeing him on, within the next month to talk about the plan for your thyroid.  If you have questions please call.

## 2011-01-01 ENCOUNTER — Telehealth: Payer: Self-pay | Admitting: Family Medicine

## 2011-01-01 NOTE — Telephone Encounter (Signed)
Pt reports that he "lost voice", + cough, diagnosed with allergies yesterday.  Called the emergency line and states that he is concerned about taking the prescribed loratadine b/c he saw on the drug info sheet that he shouldn't take it if he has thyroid problems.  He is concerned that there may be a drug interaction.  I informed pt that he is safe to take loratadine with his other medications.   Pt instructed to call office on Monday for work in appointment if any new or worsening of symptoms.  Pt states understanding.

## 2011-01-04 ENCOUNTER — Telehealth: Payer: Self-pay | Admitting: Family Medicine

## 2011-01-04 NOTE — Telephone Encounter (Signed)
States he is taking the claritin & using eyes drops. Eyes remain very red. States his throat is so sore he can barely swallow. Asked him to come in to be seen. States he cannot come until tomorrow pm. He will make an appt for then. Continues to smoke a few cigarettes a day

## 2011-01-04 NOTE — Telephone Encounter (Signed)
Med prescribed is not helping, eyes still red, has a sore throat, wants to know what he should do? Was just here last Friday.

## 2011-01-26 ENCOUNTER — Ambulatory Visit: Payer: Self-pay | Admitting: Family Medicine

## 2011-02-06 ENCOUNTER — Inpatient Hospital Stay (INDEPENDENT_AMBULATORY_CARE_PROVIDER_SITE_OTHER)
Admission: RE | Admit: 2011-02-06 | Discharge: 2011-02-06 | Disposition: A | Discharge status: Home / Self Care | Payer: Self-pay | Source: Ambulatory Visit | Attending: Emergency Medicine | Admitting: Emergency Medicine

## 2011-02-06 ENCOUNTER — Telehealth: Payer: Self-pay | Admitting: Family Medicine

## 2011-02-06 DIAGNOSIS — L509 Urticaria, unspecified: Secondary | ICD-10-CM

## 2011-02-06 DIAGNOSIS — T7840XA Allergy, unspecified, initial encounter: Secondary | ICD-10-CM

## 2011-02-06 NOTE — Telephone Encounter (Signed)
Received call from pt about itching and redness on R shoulder. Present x 1 day. No fevers, N/V. No abd pain. Has multiple spots on R shoulder that are red and itchy. Went outside yesterday, but denies any known bug bites. No shoulder pain. Currently on metoprolol and PTU for thyroid issues. No recent change in medications. Has compliant with medication on a daily basis. Has previously been on claritin. Has not taken for >1 month. Pt concerned that he may be having reaction to PTU. Denies any hx/o reactions to PTU. Has been on medication since February. Instructed to take claritin as prescribed. Pt very concerned. Instructed pt to go to UC for evaluation given concern. Pt agreeable.

## 2011-02-11 ENCOUNTER — Ambulatory Visit: Payer: Self-pay | Admitting: Family Medicine

## 2011-02-14 ENCOUNTER — Ambulatory Visit: Payer: Self-pay | Admitting: Family Medicine

## 2011-02-22 ENCOUNTER — Telehealth: Payer: Self-pay | Admitting: Family Medicine

## 2011-02-22 NOTE — Telephone Encounter (Signed)
Rash for 2-3 weeks.  Went to urgent care 2 weeks ago and was told it was due to detergent exposure.  States he continues to have bumps down his right side, very itchy.  Otherwise well with no systemic symptoms.    He states he would rather not wait until 8:30 to call as he needs to catch an early bus to get to the clinic.  Advised there might be a wait until able to be worked in.  Will flag to clinic staff to make him a same day appt.

## 2011-02-24 ENCOUNTER — Telehealth: Payer: Self-pay | Admitting: Family Medicine

## 2011-02-24 NOTE — Telephone Encounter (Addendum)
Spoke with patient and he states he went to Urgent Care 3 weeks ago with the breaking out and itching.  Was told it may be detergent he is using . He has continued with same problem and has worsened. Advised if feels needs to be seen today to go to Urgent Care . He ask for appointment tomorrow and appointment is scheduled.    States he has been without PTU for a week because he cannot afford. He is asking for assistance from our office . Advised that we can only help once every 6 months and he was last helped 10/04/2010.

## 2011-02-24 NOTE — Telephone Encounter (Signed)
Pt states he started breaking out all over from taking the PTU and so he stopped taking it a week ago.  Needs to talk to nurse to see what he should do.

## 2011-02-25 ENCOUNTER — Telehealth: Payer: Self-pay | Admitting: Family Medicine

## 2011-02-25 ENCOUNTER — Ambulatory Visit (INDEPENDENT_AMBULATORY_CARE_PROVIDER_SITE_OTHER): Payer: Self-pay | Admitting: Sports Medicine

## 2011-02-25 ENCOUNTER — Encounter: Payer: Self-pay | Admitting: Sports Medicine

## 2011-02-25 VITALS — BP 124/76 | HR 60 | Temp 98.0°F | Wt 147.9 lb

## 2011-02-25 DIAGNOSIS — B354 Tinea corporis: Secondary | ICD-10-CM | POA: Insufficient documentation

## 2011-02-25 LAB — COMPREHENSIVE METABOLIC PANEL
Alkaline Phosphatase: 107 U/L (ref 39–117)
BUN: 13 mg/dL (ref 6–23)
CO2: 29 mEq/L (ref 19–32)
Chloride: 105 mEq/L (ref 96–112)
Creat: 1.08 mg/dL (ref 0.50–1.35)
Total Bilirubin: 0.4 mg/dL (ref 0.3–1.2)

## 2011-02-25 MED ORDER — DIPHENHYDRAMINE HCL 50 MG/ML IJ SOLN
25.0000 mg | Freq: Once | INTRAMUSCULAR | Status: AC
  Administered 2011-02-25: 25 mg via INTRAMUSCULAR

## 2011-02-25 MED ORDER — TERBINAFINE HCL 250 MG PO TABS: 250.0000 mg | ORAL_TABLET | Freq: Every day | ORAL | Status: DC

## 2011-02-25 MED ORDER — METHYLPREDNISOLONE SODIUM SUCC 125 MG IJ SOLR
125.0000 mg | Freq: Once | INTRAMUSCULAR | Status: AC
  Administered 2011-02-25: 125 mg via INTRAMUSCULAR

## 2011-02-25 MED ORDER — HYDROXYZINE HCL 25 MG PO TABS: 25.0000 mg | ORAL_TABLET | Freq: Four times a day (QID) | ORAL | Status: AC | PRN

## 2011-02-25 NOTE — Telephone Encounter (Signed)
States he cannot afford hydrOXYzine (ATARAX) 25 MG tablet  And wants to know if there is anything cheaper.  [facility] [address] Please let pt know

## 2011-02-25 NOTE — Progress Notes (Signed)
Addended byArlyss Repress on: 02/25/2011 11:22 AM   Modules accepted: Orders

## 2011-02-25 NOTE — Telephone Encounter (Signed)
Advised benadryl.  Will need to see me in clinic in 2 weeks to f/u his thyroid disease.

## 2011-02-25 NOTE — Progress Notes (Signed)
  Subjective:    Patient ID: Kala Gassmann, male    DOB: 03-03-1966, 45 y.o.   MRN: [id number]  Rash    Itching:  All over body, circular scaly lesions present for about 2 weeks.  Daughter with similar symptoms.  Can't sleep, miserable with severe itching.  No lesions on head.  No fevers/chills.  No changes in detergents, soaps.  Has tried lotions but no improvement.  Review of Systems  Skin: Positive for rash.      See HPI Objective:   Physical Exam  Constitutional: He appears well-developed and well-nourished. He appears distressed.  Skin: Skin is warm and dry.       Multiple lesions present over entire body in no particular distribution, approx 1-2 cm across, scaly border, advancing edge.  There are also several papular lesions with excoriations.  There are no signs bacterial superinfection.      Assessment & Plan:

## 2011-02-25 NOTE — Assessment & Plan Note (Signed)
Solumedrol 250 IM Benadryl 25 IM. Terbinafine 250 daily for 2 weeks. Hydroxyzine 25 oral prn. CMET as sclerae minimally yellow.

## 2011-02-25 NOTE — Patient Instructions (Signed)
Great to see you. You have tinea corporis. Shots of steroid and benadryl. Terbinafine daily for 2 weeks. Hydroxyzine for itching. Come back to see [place] as needed.  Ihor Austin. Benjamin Stain, M.D. [facility] [address] [place] [address] [phone]  Ringworm - Body (Tinea Corporis) Ringworm is a fungal infection of the skin and hair. Another name for this problem is Tinea Corporis. It has nothing to do with worms. A fungus is an organism that lives on dead cells (the outer layer of skin). It can involve the entire body. It can spread from infected pets. Tinea corporis can be a problem in wrestlers who may get the infection form other players/opponents, equipment and mats. DIAGNOSIS A skin scraping can be obtained from the affected area and by looking for fungus under the microscope. This is called a KOH examination.   HOME CARE INSTRUCTIONS  Ringworm may be treated with a topical antifungal cream, ointment, or oral medications.   If you are using a cream or ointment, wash infected skin. Dry it completely before application.   Scrub the skin with a buff puff or abrasive sponge using a shampoo with ketoconazole to remove dead skin and help treat the ringworm.   Have your pet treated by your veterinarian if it has the same infection.  SEEK MEDICAL CARE IF:  The ringworm patch (fungus) continues to spread after 7 days of treatment.   The rash is not gone in 4 weeks. Fungal infections are slow to respond to treatment. Some redness (erythema) may remain for several weeks after the fungus is gone.   The area becomes red, warm, tender, and swollen beyond the patch. This may be a secondary bacterial (germ) infection.   An oral temperature above 100.1F develops.  Document Released: 08/26/2000 Document Re-Released: 06/26/2009 [facility] Patient Information 2011 [place], [place].

## 2011-03-02 ENCOUNTER — Telehealth: Payer: Self-pay | Admitting: Family Medicine

## 2011-03-02 NOTE — Telephone Encounter (Signed)
Called and informed patient of lab results.Busick, Rodena Medin

## 2011-03-02 NOTE — Telephone Encounter (Signed)
Pt calling for lab results he had taken on Friday.  Was told to call back to day to receive

## 2011-03-07 ENCOUNTER — Telehealth: Payer: Self-pay | Admitting: Family Medicine

## 2011-03-07 NOTE — Telephone Encounter (Signed)
The medication that was prescribed is not helping the rash and he would like to talk to someone about having it changed.

## 2011-03-07 NOTE — Telephone Encounter (Signed)
Pt needs OV to evaluate rash. I left a message for pt to come back into the office. Lorenda Hatchet, Renato Battles

## 2011-03-07 NOTE — Telephone Encounter (Signed)
Patient called wanting to speak to the director because he doesn't need to be seen to get his refill medication filled.  During the call he was angered and hung up the phone.

## 2011-03-07 NOTE — Telephone Encounter (Signed)
Is calling because his Medicaid is in process but

## 2011-03-07 NOTE — Telephone Encounter (Signed)
There was confusion in what pt is asking, pt is supposed to be taking thyroid medication but pt is out & does not have medicaid and cannot afford it, pt knows [facility] helped him with our pt assistance fund & wants to know if we can help again to get his thyroid medication.

## 2011-03-21 ENCOUNTER — Encounter: Payer: Self-pay | Admitting: Family Medicine

## 2011-03-21 ENCOUNTER — Ambulatory Visit (INDEPENDENT_AMBULATORY_CARE_PROVIDER_SITE_OTHER): Payer: Self-pay | Admitting: Family Medicine

## 2011-03-21 VITALS — BP 115/74 | HR 76 | Temp 98.3°F | Ht 68.75 in | Wt 146.8 lb

## 2011-03-21 DIAGNOSIS — L299 Pruritus, unspecified: Secondary | ICD-10-CM

## 2011-03-21 DIAGNOSIS — E0590 Thyrotoxicosis, unspecified without thyrotoxic crisis or storm: Secondary | ICD-10-CM

## 2011-03-21 LAB — T4, FREE: Free T4: 1.11 ng/dL (ref 0.80–1.80)

## 2011-03-21 NOTE — Assessment & Plan Note (Signed)
Not taking PTU yet as he cannot afford it. Additionally has not yet gotten his orange card.  When Mr Tugwell gets his card will get a uptake scan and precede to ablation likely.  Will f/u in 2 weeks.

## 2011-03-21 NOTE — Patient Instructions (Addendum)
Thank you for coming in today. Get your orange card.  Take your medicine.  Come back in 2-4 weeks.  I will check your thyroid today.  Try gold bond itch or the generic medicine.

## 2011-03-21 NOTE — Progress Notes (Signed)
1) Hyperthyroid: Doing well. Is taking beta blocker only. Has not yet picked up the PTU as he has trouble affording it. He has yet to go get the orange card, however he has just been approved and should be able to get it this week. He has been able to gain some weight. Feels well overall.   2) Itching: Continues to have itching and small outbreaks of itchy bumps. The lamisil did help some but he feels it is getting worse. He notes continuous mild itching of the upper extremities and trunk. Benadryl helps some but it makes him tired. He denies any mucocutaneous involvement.  No-one else in his house is itchy. He denies any possibility of fleas, bed bugs or scabies. He feels well otherwise.   PMH reviewed.  ROS as above otherwise neg  Exam:  Vs noted.  Gen: Well NAD HEENT: EOMI, PERRL, MMM. No thyroid mass.  Lungs: CTABL Nl WOB Heart: RRR no MRG Abd: NABS, NT, ND Exts: Non edematous BL  LE Skin: Small white or skin colored papules on arm and trunk. No surrounding erythremia.  Not in the inner web space. No scaly lesions. Not excoriated.

## 2011-03-21 NOTE — Assessment & Plan Note (Signed)
Not sure the etiology.  His rash is not characteristic of anything. Additionally I do not think this is a dangerous rash at this time.  Plan: Symptom control with gold bond itch. Will f/u in 2- 4 weeks. Hopefull at this time the rash will either resolve or mature into something that is more identifiable. Red flags reviewed.  Will f/u.

## 2011-04-05 ENCOUNTER — Encounter: Payer: Self-pay | Admitting: Family Medicine

## 2011-04-06 ENCOUNTER — Ambulatory Visit: Payer: Self-pay | Admitting: Family Medicine

## 2011-04-18 ENCOUNTER — Encounter: Payer: Self-pay | Admitting: Cardiovascular Disease

## 2011-04-21 ENCOUNTER — Ambulatory Visit: Payer: Self-pay | Admitting: Family Medicine

## 2011-04-26 ENCOUNTER — Ambulatory Visit: Payer: Self-pay | Admitting: Cardiovascular Disease

## 2011-06-10 ENCOUNTER — Ambulatory Visit: Payer: Self-pay | Admitting: Family Medicine

## 2011-07-04 ENCOUNTER — Telehealth: Payer: Self-pay | Admitting: Family Medicine

## 2011-07-04 ENCOUNTER — Telehealth: Payer: Self-pay | Admitting: *Deleted

## 2011-07-04 NOTE — Telephone Encounter (Signed)
Pt needs to talk to nurse about his meds.  He has an appt on 10/24 but wants to talk to nurse

## 2011-07-04 NOTE — Telephone Encounter (Signed)
Tried to call pt. Unable to reach (long song .Marland Kitchen) waiting for call back. Lorenda Hatchet, Renato Battles

## 2011-07-04 NOTE — Telephone Encounter (Signed)
Called pt. Pt reports, that he needs help ASAP with his medicine. He had help before with nurse Larita Fife. Needs his thyroid medications today. Advised to go to [facility] for help and also to keep his appt with Dr.Corey on Wednesday. He agreed, but said, that he needs help today with his meds, because they made him feel better. He has been out of his thyroid medications for a long time. It cost $45 at [facility] on [address].  I told the pt, that I would send his request to [facility] .Arlyss Repress

## 2011-07-04 NOTE — Telephone Encounter (Signed)
Spoke with patient and advised that since he has not followed through with getting set up with Kenneth Jimenez  will will not be able to help him again from our indigent funds with his meds .  He states he did contact [facility]  and he needs to get some paperwork back to her about one of his twins and he is waiting for social services to get that information to him.   Kenneth Jimenez also spoke with patient on the phone and tried to explain to him. Kenneth Jimenez was contacted and she states he started the application in January and never completed.  I called patient back to tell him this and he states he has talked to her today and she states he is cleared to see [place].   While patient held on phone we called [facility] Kidney and she states she has not talked to him today and he never finished the application.. When I came back to phone patient had hung up.

## 2011-07-06 ENCOUNTER — Ambulatory Visit: Payer: Self-pay | Admitting: Family Medicine

## 2011-09-16 ENCOUNTER — Ambulatory Visit: Payer: Self-pay | Admitting: Family Medicine

## 2012-01-04 ENCOUNTER — Emergency Department (HOSPITAL_COMMUNITY)
Admission: EM | Admit: 2012-01-04 | Discharge: 2012-01-04 | Disposition: A | Discharge status: Home / Self Care | Payer: Self-pay | Attending: Emergency Medicine | Admitting: Emergency Medicine

## 2012-01-04 ENCOUNTER — Encounter (HOSPITAL_COMMUNITY): Payer: Self-pay | Admitting: Emergency Medicine

## 2012-01-04 DIAGNOSIS — E0590 Thyrotoxicosis, unspecified without thyrotoxic crisis or storm: Secondary | ICD-10-CM | POA: Insufficient documentation

## 2012-01-04 DIAGNOSIS — W1809XA Striking against other object with subsequent fall, initial encounter: Secondary | ICD-10-CM | POA: Insufficient documentation

## 2012-01-04 DIAGNOSIS — M545 Low back pain, unspecified: Secondary | ICD-10-CM | POA: Insufficient documentation

## 2012-01-04 DIAGNOSIS — M549 Dorsalgia, unspecified: Secondary | ICD-10-CM

## 2012-01-04 DIAGNOSIS — F319 Bipolar disorder, unspecified: Secondary | ICD-10-CM | POA: Insufficient documentation

## 2012-01-04 MED ORDER — IBUPROFEN 800 MG PO TABS
800.0000 mg | ORAL_TABLET | Freq: Once | ORAL | Status: AC
  Administered 2012-01-04: 800 mg via ORAL
  Filled 2012-01-04: qty 1

## 2012-01-04 MED ORDER — CYCLOBENZAPRINE HCL 10 MG PO TABS: 10.0000 mg | ORAL_TABLET | Freq: Three times a day (TID) | ORAL | Status: AC | PRN

## 2012-01-04 MED ORDER — IBUPROFEN 800 MG PO TABS: 800.0000 mg | ORAL_TABLET | Freq: Three times a day (TID) | ORAL | Status: DC

## 2012-01-04 MED ORDER — CYCLOBENZAPRINE HCL 10 MG PO TABS
10.0000 mg | ORAL_TABLET | Freq: Once | ORAL | Status: AC
  Administered 2012-01-04: 10 mg via ORAL
  Filled 2012-01-04: qty 1

## 2012-01-04 NOTE — Discharge Instructions (Signed)
Mr Kenneth Jimenez the back pain you are having is directly over muscle.  Take ibuprofen 800 mg every 6 hours 2. Use the Flexeril muscle relaxer but do not drop with this medication. He can also use ice over the area as needed for pain. Return to the ER for bowel or bladder incontinence or high fever. PCP from the list below and to followup with if necessary.  RESOURCE GUIDE  Dental Problems  Patients with Medicaid: [facility] [phone]                                           [phone] [address] Phone:  [phone]                                                  Phone:  [phone]  If unable to pay or uninsured, contact:  [facility] or [facility]. to become qualified for the adult dental clinic.  Chronic Pain Problems Contact [facility]  [phone] Patients need to be referred by their primary care doctor.  Insufficient Money for Medicine Contact [employer] Way:  call "211" or [facility] 9014805760.  No Primary Care Doctor Call [facility]  [phone] Other agencies that provide inexpensive medical care    [facility]  [phone]    [facility]  [phone]    [facility]  [phone]    [facility]  [phone]    [facility]  [phone]    [facility]  [phone]  Psychological Services [facility] Behavioral [facility]  [phone] [facility]  [phone] [facility]   [phone] (emergency services 989-726-1384)  Substance Abuse Resources [facility]  (218) 299-3461 [facility] 9058122931 [facility] [phone] Floydene Flock [phone] Residential & Outpatient Substance Abuse Program  419-371-3543  Abuse/Neglect [facility] Child Abuse Hotline (951)675-6324 [facility] Child Abuse Hotline (217) 118-1591 (After Hours)  Emergency Shelter [facility] (928)615-5924  Maternity Homes [facility] at the [place] [facility] [place] 678-102-3958 [facility] Services [phone]  MRSA Hotline #:   (857)506-3085    [facility] Resources  [facility]     [employer]                          [facility] Dept. [address] [place]                       [address]      [address] [place] [address]  Patrecia Pace  [facility] Phone:  [phone]                                   Phone:  [phone]                 Phone:  [phone]  [facility] Phone:  [phone]  [facility] Child Abuse Hotline 872-304-4827 9794692825 (After Hours)

## 2012-01-04 NOTE — ED Provider Notes (Signed)
History     CSN: [id number]  Arrival date & time 01/04/12  1010   None     Chief Complaint  Patient presents with  . Fall    fell in shower lower back pain yesterday   . Back Pain    (Consider location/radiation/quality/duration/timing/severity/associated sxs/prior treatment) Patient is a 46 y.o. male presenting with fall and back pain. The history is provided by the patient. No language interpreter was used.  Fall Incident: in the shower. He landed on a hard floor. There was no blood loss. Point of impact: back. Pain location: LL back. The pain is at a severity of 6/10. The pain is moderate. He was ambulatory at the scene. There was no entrapment after the fall. There was no alcohol use involved in the accident. Pertinent negatives include no fever, no numbness, no nausea, no vomiting, no hematuria, no headaches, no loss of consciousness and no tingling. The symptoms are aggravated by activity. He has tried acetaminophen for the symptoms. The treatment provided no relief.  Back Pain  Pertinent negatives include no fever, no numbness, no headaches, no dysuria, no tingling and no weakness.   Patient states he fell in shower yesterday. He has left lower back pain from where he hit the door of the shower. No point tenderness over the lumbar spine. No incontinence of bladder or bowels. Ambulating without difficulty.  Patient also upset because he had to wait over an hour to be seen.  States that the police officer in the waiting room was rude to him.  Past Medical History  Diagnosis Date  . Hyperthyroidism   . Bipolar disorder   . Chest pain   . Anorexia   . Atrial fib/flutter, transient   . Tachyarrhythmia   . Palpitations   . Back pain     History reviewed. No pertinent past surgical history.  Family History  Problem Relation Age of Onset  . Heart disease Mother   . Diabetes Mother   . Heart failure Mother   . Heart disease Father   . Heart attack Father     History    Substance Use Topics  . Smoking status: Current Everyday Smoker -- 0.3 packs/day    Types: Cigarettes  . Smokeless tobacco: Not on file  . Alcohol Use: No      Review of Systems  Constitutional: Negative.  Negative for fever.  HENT: Negative.  Negative for neck pain and neck stiffness.   Eyes: Negative.   Respiratory: Negative.   Cardiovascular: Negative.   Gastrointestinal: Negative.  Negative for nausea and vomiting.  Genitourinary: Negative for dysuria, urgency and hematuria.  Musculoskeletal: Positive for back pain. Negative for joint swelling and gait problem.  Skin: Negative.   Neurological: Negative.  Negative for dizziness, tingling, loss of consciousness, weakness, numbness and headaches.  Psychiatric/Behavioral: Negative.   All other systems reviewed and are negative.    Allergies  Penicillins  Home Medications   Current Outpatient Rx  Name Route Sig Dispense Refill  . ACETAMINOPHEN 500 MG PO TABS Oral Take 1,000 mg by mouth every 6 (six) hours as needed.    . IBUPROFEN 200 MG PO TABS Oral Take 200 mg by mouth every 6 (six) hours as needed. For pain      BP 121/77  Pulse 60  Temp(Src) 98 F (36.7 C) (Oral)  SpO2 100%  Physical Exam  Nursing note and vitals reviewed. Constitutional: He is oriented to person, place, and time. He appears well-developed and well-nourished.  HENT:  Head: Normocephalic and atraumatic.  Eyes: Conjunctivae and EOM are normal. Pupils are equal, round, and reactive to light.  Neck: Normal range of motion. Neck supple.  Cardiovascular: Normal rate.   Pulmonary/Chest: Effort normal.  Abdominal: Soft.  Musculoskeletal: Normal range of motion. He exhibits tenderness. He exhibits no edema.       LL back tenderness with no bruising.  Neurological: He is alert and oriented to person, place, and time.  Skin: Skin is warm and dry.  Psychiatric: He has a normal mood and affect.    ED Course  Procedures (including critical care  time)  Labs Reviewed - No data to display No results found.   No diagnosis found.    MDM  LL back pain treated with flexeril and ibuprofen with relief in the ER.  Rx for flexeril and Ibuprofen.  Return if worsening symptoms.         Remi Haggard, NP 01/05/12 [phone]

## 2012-01-04 NOTE — ED Notes (Signed)
Pt presenting to ed with c/o falling in the shower yesterday and having back pain. Pt is alert and oriented at this time. Pt is very upset wanting to speak with "whomever is in charge". Pt yelling on his cell phone charge nurse Diane is notified and aware

## 2012-01-06 NOTE — ED Provider Notes (Signed)
Medical screening examination/treatment/procedure(s) were performed by non-physician practitioner and as supervising physician I was immediately available for consultation/collaboration.  Isela Stantz T Keilin Gamboa, MD 01/06/12 1800 

## 2012-01-11 ENCOUNTER — Emergency Department (HOSPITAL_COMMUNITY)
Admission: EM | Admit: 2012-01-11 | Discharge: 2012-01-12 | Disposition: A | Discharge status: Home / Self Care | Payer: Self-pay | Attending: Emergency Medicine | Admitting: Emergency Medicine

## 2012-01-11 ENCOUNTER — Encounter (HOSPITAL_COMMUNITY): Payer: Self-pay | Admitting: Emergency Medicine

## 2012-01-11 ENCOUNTER — Telehealth: Payer: Self-pay | Admitting: Family Medicine

## 2012-01-11 DIAGNOSIS — J029 Acute pharyngitis, unspecified: Secondary | ICD-10-CM | POA: Insufficient documentation

## 2012-01-11 DIAGNOSIS — E0590 Thyrotoxicosis, unspecified without thyrotoxic crisis or storm: Secondary | ICD-10-CM | POA: Insufficient documentation

## 2012-01-11 DIAGNOSIS — F17200 Nicotine dependence, unspecified, uncomplicated: Secondary | ICD-10-CM | POA: Insufficient documentation

## 2012-01-11 DIAGNOSIS — Z79899 Other long term (current) drug therapy: Secondary | ICD-10-CM | POA: Insufficient documentation

## 2012-01-11 DIAGNOSIS — F319 Bipolar disorder, unspecified: Secondary | ICD-10-CM | POA: Insufficient documentation

## 2012-01-11 NOTE — ED Notes (Signed)
Pt alert, nad, c/o "trouble swallowing", onset this evening, resp even unlabored, skin pwd, speech clear, no stridor noted, tolerating oral secretions well

## 2012-01-11 NOTE — ED Notes (Signed)
Throat swollen and dry when trying to swallow

## 2012-01-11 NOTE — Telephone Encounter (Signed)
Patient called complaining that he has not been feeling well lately but has not had enough money to come into clinic in a long time.  He says he feels like his thyroid is larger and is making his throat sore.  He also says he went to the ER about a week ago with back pain.  He repeatedly says this is all from his thyroid.  Patient says that he is not having trouble breathing, and that if he has trouble breathing or feels like his throat is swelling he knows to call 911.  He was wondering if we could call in a medication for him.    I told him that we need to see him in the office, and he should call first thing in the morning for an appointment.  Did re-iterate to call 911 if he is having difficulty breathing.    Sierra Bissonette 01/11/2012 10:44 PM

## 2012-01-11 NOTE — ED Notes (Signed)
Pt tonsils reddened, white blisters noted

## 2012-01-12 MED ORDER — HYDROCODONE-ACETAMINOPHEN 7.5-500 MG/15ML PO SOLN
10.0000 mL | Freq: Once | ORAL | Status: AC
  Administered 2012-01-12: 10 mL via ORAL
  Filled 2012-01-12: qty 15

## 2012-01-12 MED ORDER — HYDROCODONE-ACETAMINOPHEN 7.5-500 MG/15ML PO SOLN: 15.0000 mL | Freq: Four times a day (QID) | ORAL | Status: AC | PRN

## 2012-01-12 MED ORDER — CEPHALEXIN 500 MG PO CAPS: ORAL_CAPSULE | ORAL | Status: DC

## 2012-01-12 NOTE — Discharge Instructions (Signed)
Take antibiotic in completion. Alternate between ibuprofen for pain lortab for breakthrough pain but do not drive or operate machinery with lortab use. It is very important to follow up with your primary care provider tomorrow to further assess your thyroid and to recheck your throat. Return to ER at any time for changing or worsening of symptoms.   Pharyngitis, Viral and Bacterial Pharyngitis is soreness (inflammation) or infection of the pharynx. It is also called a sore throat. CAUSES  Most sore throats are caused by viruses and are part of a cold. However, some sore throats are caused by strep and other bacteria. Sore throats can also be caused by post nasal drip from draining sinuses, allergies and sometimes from sleeping with an open mouth. Infectious sore throats can be spread from person to person by coughing, sneezing and sharing cups or eating utensils. TREATMENT  Sore throats that are viral usually last 3-4 days. Viral illness will get better without medications (antibiotics). Strep throat and other bacterial infections will usually begin to get better about 24-48 hours after you begin to take antibiotics. HOME CARE INSTRUCTIONS   If the caregiver feels there is a bacterial infection or if there is a positive strep test, they will prescribe an antibiotic. The full course of antibiotics must be taken. If the full course of antibiotic is not taken, you or your child may become ill again. If you or your child has strep throat and do not finish all of the medication, serious heart or kidney diseases may develop.   Drink enough water and fluids to keep your urine clear or pale yellow.   Only take over-the-counter or prescription medicines for pain, discomfort or fever as directed by your caregiver.   Get lots of rest.   Gargle with salt water ( tsp. of salt in a glass of water) as often as every 1-2 hours as you need for comfort.   Hard candies may soothe the throat if individual is not at  risk for choking. Throat sprays or lozenges may also be used.  SEEK MEDICAL CARE IF:   Large, tender lumps in the neck develop.   A rash develops.   Green, yellow-brown or bloody sputum is coughed up.   Your baby is older than 3 months with a rectal temperature of 100.5 F (38.1 C) or higher for more than 1 day.  SEEK IMMEDIATE MEDICAL CARE IF:   A stiff neck develops.   You or your child are drooling or unable to swallow liquids.   You or your child are vomiting, unable to keep medications or liquids down.   You or your child has severe pain, unrelieved with recommended medications.   You or your child are having difficulty breathing (not due to stuffy nose).   You or your child are unable to fully open your mouth.   You or your child develop redness, swelling, or severe pain anywhere on the neck.   You have a fever.   Your baby is older than 3 months with a rectal temperature of 102 F (38.9 C) or higher.   Your baby is 31 months old or younger with a rectal temperature of 100.4 F (38 C) or higher.  MAKE SURE YOU:   Understand these instructions.   Will watch your condition.   Will get help right away if you are not doing well or get worse.  Document Released: 08/29/2005 Document Revised: 08/18/2011 Document Reviewed: 11/26/2007 [facility] Patient Information 2012 [place], [place].Pharyngitis, Viral and Bacterial  Pharyngitis is soreness (inflammation) or infection of the pharynx. It is also called a sore throat. CAUSES  Most sore throats are caused by viruses and are part of a cold. However, some sore throats are caused by strep and other bacteria. Sore throats can also be caused by post nasal drip from draining sinuses, allergies and sometimes from sleeping with an open mouth. Infectious sore throats can be spread from person to person by coughing, sneezing and sharing cups or eating utensils. TREATMENT  Sore throats that are viral usually last 3-4 days. Viral illness  will get better without medications (antibiotics). Strep throat and other bacterial infections will usually begin to get better about 24-48 hours after you begin to take antibiotics. HOME CARE INSTRUCTIONS   If the caregiver feels there is a bacterial infection or if there is a positive strep test, they will prescribe an antibiotic. The full course of antibiotics must be taken. If the full course of antibiotic is not taken, you or your child may become ill again. If you or your child has strep throat and do not finish all of the medication, serious heart or kidney diseases may develop.   Drink enough water and fluids to keep your urine clear or pale yellow.   Only take over-the-counter or prescription medicines for pain, discomfort or fever as directed by your caregiver.   Get lots of rest.   Gargle with salt water ( tsp. of salt in a glass of water) as often as every 1-2 hours as you need for comfort.   Hard candies may soothe the throat if individual is not at risk for choking. Throat sprays or lozenges may also be used.  SEEK MEDICAL CARE IF:   Large, tender lumps in the neck develop.   A rash develops.   Green, yellow-brown or bloody sputum is coughed up.   Your baby is older than 3 months with a rectal temperature of 100.5 F (38.1 C) or higher for more than 1 day.  SEEK IMMEDIATE MEDICAL CARE IF:   A stiff neck develops.   You or your child are drooling or unable to swallow liquids.   You or your child are vomiting, unable to keep medications or liquids down.   You or your child has severe pain, unrelieved with recommended medications.   You or your child are having difficulty breathing (not due to stuffy nose).   You or your child are unable to fully open your mouth.   You or your child develop redness, swelling, or severe pain anywhere on the neck.   You have a fever.   Your baby is older than 3 months with a rectal temperature of 102 F (38.9 C) or higher.    Your baby is 44 months old or younger with a rectal temperature of 100.4 F (38 C) or higher.  MAKE SURE YOU:   Understand these instructions.   Will watch your condition.   Will get help right away if you are not doing well or get worse.  Document Released: 08/29/2005 Document Revised: 08/18/2011 Document Reviewed: 11/26/2007 [facility] Patient Information 2012 [place], [place].

## 2012-01-12 NOTE — ED Provider Notes (Signed)
History     CSN: [id number]  Arrival date & time 01/11/12  2322   First MD Initiated Contact with Patient 01/11/12 2343      Chief Complaint  Patient presents with  . Dysphagia    (Consider location/radiation/quality/duration/timing/severity/associated sxs/prior treatment) HPI  Patient presents to ER complaining of a 24 hour hx of gradual onset painful swallowing and sore throat. Patient states that he had chills and sweats yesterday but denies known fever. He states that he gets a "stuck sensation" when he tries to swallow food and it is painful but "then it goes down." Patient states that he had past hx of hypothyroid by stopped his medications so he called his PCP at [facility] and established follow up appointment for tomorrow when he had initial sore throat stating "I didn't know if your thyroid could make your throat hurt." he denies fevers, HA, earache, cough, CP, SOB, rash, difficulty breathing or swelling of face or throat.   Past Medical History  Diagnosis Date  . Hyperthyroidism   . Bipolar disorder   . Chest pain   . Anorexia   . Atrial fib/flutter, transient   . Tachyarrhythmia   . Palpitations   . Back pain     History reviewed. No pertinent past surgical history.  Family History  Problem Relation Age of Onset  . Heart disease Mother   . Diabetes Mother   . Heart failure Mother   . Heart disease Father   . Heart attack Father     History  Substance Use Topics  . Smoking status: Current Everyday Smoker -- 0.3 packs/day    Types: Cigarettes  . Smokeless tobacco: Not on file  . Alcohol Use: No      Review of Systems  All other systems reviewed and are negative.    Allergies  Penicillins  Home Medications   Current Outpatient Rx  Name Route Sig Dispense Refill  . CYCLOBENZAPRINE HCL 10 MG PO TABS Oral Take 1 tablet (10 mg total) by mouth 3 (three) times daily as needed for muscle spasms. 15 tablet 0  . IBUPROFEN 200 MG PO TABS Oral Take 600 mg by  mouth every 6 (six) hours as needed. For pain    . CEPHALEXIN 500 MG PO CAPS  One PO TID x 7 days 21 capsule 0  . HYDROCODONE-ACETAMINOPHEN 7.5-500 MG/15ML PO SOLN Oral Take 15 mLs by mouth every 6 (six) hours as needed for pain. 120 mL 0    BP 132/89  Pulse 98  Temp 98 F (36.7 C)  Resp 16  Wt 145 lb (65.772 kg)  SpO2 99%  Physical Exam  Vitals reviewed. Constitutional: He is oriented to person, place, and time. He appears well-developed and well-nourished. No distress.  HENT:  Head: Normocephalic and atraumatic.  Right Ear: External ear normal.  Left Ear: External ear normal.  Nose: Nose normal.  Mouth/Throat: No oropharyngeal exudate.       moderate erythema of posterior pharynx and tonsils with no tonsillar enlargement but scant exudate on bilateral tonsils. Patent airway. Swallowing secretions well  Eyes: Conjunctivae and EOM are normal. Pupils are equal, round, and reactive to light.  Neck: Normal range of motion. Neck supple. No thyromegaly present.  Cardiovascular: Normal rate, regular rhythm and normal heart sounds.  Exam reveals no gallop and no friction rub.   No murmur heard. Pulmonary/Chest: Effort normal and breath sounds normal. No respiratory distress. He has no wheezes. He has no rales. He exhibits no tenderness.  Abdominal:  Soft. He exhibits no distension and no mass. There is no tenderness. There is no rebound and no guarding.  Lymphadenopathy:    He has no cervical adenopathy.  Neurological: He is alert and oriented to person, place, and time. He has normal reflexes.  Skin: Skin is warm and dry. No rash noted. He is not diaphoretic.  Psychiatric: He has a normal mood and affect.    ED Course  Procedures (including critical care time)  Labs Reviewed - No data to display No results found.   1. Pharyngitis       MDM  Afebrile, non toxic appearing. Swallowing water well. F/u with PCP tomorrow to address thyroid concerns and recheck of pharyngitis. Will  cover with abx for strep, clinical diagnosis given exudate. Patent airway.         Jenness Corner, [place] 01/12/12 0101

## 2012-01-12 NOTE — ED Provider Notes (Signed)
Medical screening examination/treatment/procedure(s) were performed by non-physician practitioner and as supervising physician I was immediately available for consultation/collaboration.  Olivia Mackie, MD 01/12/12 [phone]

## 2012-05-24 ENCOUNTER — Emergency Department (HOSPITAL_COMMUNITY)
Admission: EM | Admit: 2012-05-24 | Discharge: 2012-05-24 | Disposition: A | Discharge status: Home / Self Care | Payer: Self-pay | Attending: Emergency Medicine | Admitting: Emergency Medicine

## 2012-05-24 ENCOUNTER — Encounter (HOSPITAL_COMMUNITY): Payer: Self-pay | Admitting: Family Medicine

## 2012-05-24 ENCOUNTER — Emergency Department (HOSPITAL_COMMUNITY): Payer: Self-pay

## 2012-05-24 DIAGNOSIS — M542 Cervicalgia: Secondary | ICD-10-CM | POA: Insufficient documentation

## 2012-05-24 DIAGNOSIS — R209 Unspecified disturbances of skin sensation: Secondary | ICD-10-CM | POA: Insufficient documentation

## 2012-05-24 DIAGNOSIS — T148XXA Other injury of unspecified body region, initial encounter: Secondary | ICD-10-CM | POA: Insufficient documentation

## 2012-05-24 DIAGNOSIS — W208XXA Other cause of strike by thrown, projected or falling object, initial encounter: Secondary | ICD-10-CM | POA: Insufficient documentation

## 2012-05-24 HISTORY — DX: Bradycardia, unspecified: R00.1

## 2012-05-24 HISTORY — DX: Unspecified convulsions: R56.9

## 2012-05-24 MED ORDER — METHOCARBAMOL 500 MG PO TABS: 1000.0000 mg | ORAL_TABLET | Freq: Four times a day (QID) | ORAL | Status: AC | PRN

## 2012-05-24 MED ORDER — FENTANYL CITRATE 0.05 MG/ML IJ SOLN
50.0000 ug | Freq: Once | INTRAMUSCULAR | Status: AC
  Administered 2012-05-24: 50 ug via INTRAMUSCULAR
  Filled 2012-05-24: qty 2

## 2012-05-24 MED ORDER — HYDROCODONE-ACETAMINOPHEN 5-325 MG PO TABS: ORAL_TABLET | ORAL | Status: AC

## 2012-05-24 NOTE — ED Notes (Signed)
Switch board called this nurse stating that pt is requesting to speak to the administrator re his care, made him aware that i will go in his room to speak to pt.  Pt states that he is ready to go, that he has kids waiting on him.  Pt has a cup in his hand drinking without problems.  Pt pacing the room without difficulty.  Pt reports that his pain is much better.  [facility] RN notified pt that he is up for discharge but his d/c instruction is not ready as of yet.  Pt noted to yell on the phone and slamming the phone on the receiver.

## 2012-05-24 NOTE — ED Notes (Signed)
Patient states that this morning he reached into his cabinet to get sugar and a can fell from the cabinet striking him in the back of his neck. Since then, having intermittent numbness and tingling in right arm.

## 2012-05-24 NOTE — ED Notes (Signed)
Pt reports reaching for something in his kitchen cabinet around 0500 for his coffee, when a can of peas fell and hit the back of his neck.  Pt reports pain and tingling in his R hand and finger.  Pt unable to move his head from side to side d/t pain.  Pt appears tense could be from pain.  Pt was moved to Res A because per staff pt was reporting that he has been in the waiting room since 0630 and has seen other pts who arrived to the ED after him go back before him.  Dr. Clarene Duke explained reason for delay to pt.  Pt verbalizes understanding.  Pt is calm and cooperative and pleasant at present.  Dr. Clarene Duke did not need pt placed on c-collar at this time.  Pt sitting in a chair in room.  Pt states that he would be okay to ambulate to radiology

## 2012-05-24 NOTE — Progress Notes (Signed)
Pt seen by [facility] community liasion. Pt allowed to ventilated his feelings about his ED visit.  Pt not interested in information for provider assistance at this time.  Pt seen walking through ED units at intervals then in [facility]B

## 2012-05-24 NOTE — ED Provider Notes (Signed)
History     CSN: [id number]  Arrival date & time 05/24/12  4098   First MD Initiated Contact with Patient 05/24/12 [phone]      Chief Complaint  Patient presents with  . Neck Injury    HPI Pt was seen at 0850.  Per pt, c/o gradual onset and persistence of constant neck "pain" that began this morning approx 0500 PTA.  Pt states he was reaching up into a cabinet above his head and "a can of peas" fell onto the back of his neck. States since then he has been having "numbness" and "tingling" of entire right UE.  Denies focal motor weakness, no syncope, no head injury.     Past Medical History  Diagnosis Date  . Hyperthyroidism   . Bipolar disorder   . Chest pain   . Anorexia   . Atrial fib/flutter, transient   . Tachyarrhythmia   . Palpitations   . Back pain   . Bradycardia   . Seizures     History reviewed. No pertinent past surgical history.  Family History  Problem Relation Age of Onset  . Heart disease Mother   . Diabetes Mother   . Heart failure Mother   . Heart disease Father   . Heart attack Father     History  Substance Use Topics  . Smoking status: Current Every Day Smoker -- 0.2 packs/day    Types: Cigarettes  . Smokeless tobacco: Not on file  . Alcohol Use: 0.6 oz/week    1 Cans of beer per week     Occasional       Review of Systems ROS: Statement: All systems negative except as marked or noted in the HPI; Constitutional: Negative for fever and chills. ; ; Eyes: Negative for eye pain, redness and discharge. ; ; ENMT: Negative for ear pain, hoarseness, nasal congestion, sinus pressure and sore throat. ; ; Cardiovascular: Negative for chest pain, palpitations, diaphoresis, dyspnea and peripheral edema. ; ; Respiratory: Negative for cough, wheezing and stridor. ; ; Gastrointestinal: Negative for nausea, vomiting, diarrhea, abdominal pain, blood in stool, hematemesis, jaundice and rectal bleeding. . ; ; Genitourinary: Negative for dysuria, flank pain and  hematuria. ; ; Musculoskeletal: +neck pain. Negative for back pain. Negative for swelling and trauma.; ; Skin: Negative for pruritus, rash, abrasions, blisters, bruising and skin lesion.; ; Neuro: +paresthesias. Negative for headache, lightheadedness and neck stiffness. Negative for weakness, altered level of consciousness , altered mental status, extremity weakness, involuntary movement, seizure and syncope.     Allergies  Penicillins  Home Medications   Current Outpatient Rx  Name Route Sig Dispense Refill  . DIVALPROEX SODIUM ER 500 MG PO TB24 Oral Take 1,000 mg by mouth 2 (two) times daily.    . QUETIAPINE FUMARATE 100 MG PO TABS Oral Take 100 mg by mouth at bedtime.    . CEPHALEXIN 500 MG PO CAPS  One PO TID x 7 days 21 capsule 0    BP 99/52  Pulse 64  Temp 98.5 F (36.9 C) (Oral)  Resp 18  SpO2 100%  Physical Exam 0855: Physical examination:  Nursing notes reviewed; Vital signs and O2 SAT reviewed;  Constitutional: Well developed, Well nourished, Well hydrated, In no acute distress; Head:  Normocephalic, atraumatic; Eyes: EOMI, PERRL, No scleral icterus; ENMT: Mouth and pharynx normal, Mucous membranes moist; Neck: Supple, Full range of motion, No lymphadenopathy; Cardiovascular: Regular rate and rhythm, No gallop; Respiratory: Breath sounds clear & equal bilaterally, No rales, rhonchi, wheezes.  Speaking full sentences with ease, Normal respiratory effort/excursion; Chest: Nontender, Movement normal;;; Extremities: Pulses normal, Right shoulder w/FROM.  NT to palp entire joint, AC joint, clavicle NT, scapula NT, proximal humerus NT, biceps tendon NT over bicipital groove.  Motor strength at shoulder normal.  Sensation intact over deltoid region, distal NMS intact with right hand on having intact strength in the distribution of the median, radial, and ulnar nerve function and subjective decreased sensation to right lateral palmar and dorsal hand, intact sensation to right medial dorsal  and palmar hand.  Strong radial pulse. No tenderness, No edema, No calf edema or asymmetry.; Spine:  No midline CS, TS, LS tenderness.  +TTP L>R cervical paraspinal muscles which reproduces pt's symptoms.  No ecchymosis, no erythema, no open wounds;;  Neuro: AA&Ox3, Major CN grossly intact.  Strength 5/5 equal bilat UE's and LE's.  DTR 2/4 equal bilat UE's and LE's.  +subjective decreased sensation to right lateral dorsal and palmar hand, sensation intact right medial palmar and dorsal hand, otherwise no gross sensory deficits.  Gait steady, normal coordination taking off his shirt, moving from sitting on stretcher to standing.  Gait steady. Speech clear.  No facial droop.  No nystagmus.;;.; Skin: Color normal, Warm, Dry.   ED Course  Procedures    MDM  MDM Reviewed: nursing note, vitals and previous chart Interpretation: CT scan     Ct Cervical Spine Wo Contrast 05/24/2012  *RADIOLOGY REPORT*  Clinical Data: Right upper extremity pain, tingling.  CT CERVICAL SPINE WITHOUT CONTRAST  Technique:  Multidetector CT imaging of the cervical spine was performed. Multiplanar CT image reconstructions were also generated.  Comparison: None.  Findings: Loss of normal cervical lordosis.  No subluxation.  Disc spaces are maintained.  Minimal degenerative spurring anteriorly C5- 6.  No fracture.  No epidural or paraspinal hematoma.  No neural foraminal narrowing.  IMPRESSION: No acute bony abnormality.   Original Report Authenticated By: Cyndie Chime, M.D.      1140:  Pt has gotten himself dressed and is sitting on the stretcher reading a book and drinking from a cup without difficulty.  Feels improved after meds and wants to go home now.  Dx testing d/w pt.  Questions answered.  Verb understanding, agreeable to d/c home with outpt f/u.      Laray Anger, DO 05/27/12 1722

## 2012-05-25 ENCOUNTER — Ambulatory Visit: Payer: Self-pay | Admitting: Family Medicine

## 2013-02-06 ENCOUNTER — Ambulatory Visit (INDEPENDENT_AMBULATORY_CARE_PROVIDER_SITE_OTHER): Payer: Self-pay | Admitting: Family Medicine

## 2013-02-06 VITALS — BP 113/74 | HR 76 | Temp 98.3°F | Ht 68.75 in | Wt 137.0 lb

## 2013-02-06 DIAGNOSIS — R05 Cough: Secondary | ICD-10-CM

## 2013-02-06 DIAGNOSIS — E0590 Thyrotoxicosis, unspecified without thyrotoxic crisis or storm: Secondary | ICD-10-CM

## 2013-02-06 LAB — T4, FREE: Free T4: 1.27 ng/dL (ref 0.80–1.80)

## 2013-02-06 LAB — TSH: TSH: 0.381 u[IU]/mL (ref 0.350–4.500)

## 2013-02-06 MED ORDER — PROPYLTHIOURACIL 50 MG PO TABS: 100.0000 mg | ORAL_TABLET | Freq: Three times a day (TID) | ORAL | Status: DC

## 2013-02-06 MED ORDER — METOPROLOL TARTRATE 25 MG PO TABS: 25.0000 mg | ORAL_TABLET | Freq: Two times a day (BID) | ORAL | Status: DC

## 2013-02-06 NOTE — Assessment & Plan Note (Signed)
History of hyperthyroidism with continued symptoms.  Will check TFT's and restart PTU and metoprolol.  Follow up with pcp in 2-3 weeks.  Encouraged to apply for orange card

## 2013-02-06 NOTE — Assessment & Plan Note (Signed)
Possibly related to viral illness vs allergic rhinitis.  Declines trial of nasal spray.  Encouraged symptomatic treatment and increased fluid intake.

## 2013-02-06 NOTE — Patient Instructions (Addendum)
Thank you for coming in today, it was good to see you I would like for you to restart your PTU. Use this to see if you can get a discount: Member ID: [id number] RX Group: [id number] [id number]: [id number] RXPCN: [id number] Apply for the orange card, I will also forward to our social worker Follow up with your primary care doctor in 2-3 weeks

## 2013-02-06 NOTE — Progress Notes (Signed)
  Subjective:    Patient ID: Kenneth Jimenez, male    DOB: 1966/07/24, 47 y.o.   MRN: [id number]  HPI 1. Hyperthyroidism:  Here for f/u of hyperthyroidism.  Has been on medication for 1 year.  Since that time he reports weight loss, fatigue, heat intolerance and frequent headache.  He was previously on PTU and metoprolol.  He was supposed to have thyroid uptake done but this has not been done yet, he does not have insurance currently  2. Chest congestion:  Reports congestion and cough x2 weeks.  Coughing up thick phlegm.  Denies fever, shortness of breath, blood tinged sputum.     Review of Systems Per HPI    Objective:   Physical Exam  Constitutional: He appears well-nourished. No distress.  HENT:  Head: Normocephalic and atraumatic.  Eyes:  No proptosis   Neck: Neck supple. No thyromegaly present.  Cardiovascular: Normal rate and regular rhythm.   Pulmonary/Chest: Effort normal and breath sounds normal.  Musculoskeletal: He exhibits no edema.  Neurological: He is alert.          Assessment & Plan:

## 2013-02-07 ENCOUNTER — Telehealth: Payer: Self-pay | Admitting: *Deleted

## 2013-02-07 NOTE — Telephone Encounter (Signed)
Pt notified of lab results.  Zorana Brockwell, Darlyne Russian, CMA

## 2013-02-20 IMAGING — CT CT CERVICAL SPINE W/O CM
2 series · 10 of 14 positions shown, 12 images · non-contrast
Comparison: None.

CLINICAL DATA: Right upper extremity pain, tingling.

CT CERVICAL SPINE WITHOUT CONTRAST
TECHNIQUE: Multidetector CT imaging of the cervical spine was
performed. Multiplanar CT image reconstructions were also
generated.

[Series 3: c-spine st · axial · 0.26mm/px · z∈[-156,-46]mm · 5 of 83 slices shown, 7 images]
[im 14/83  soft-tissue]
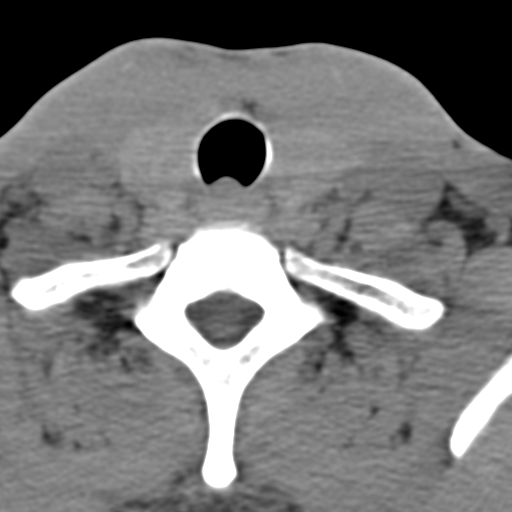
[im 14/83  bone]
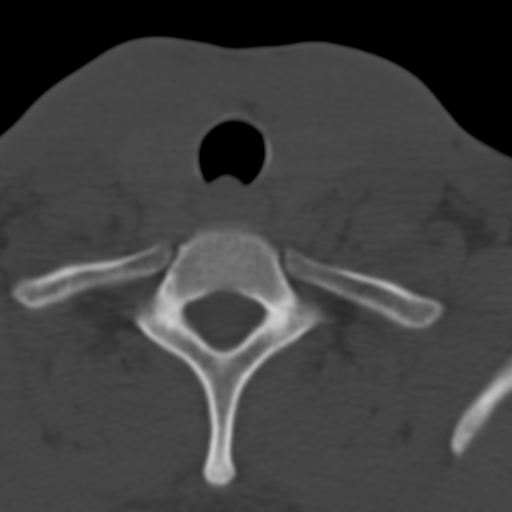
[im 28/83  bone]
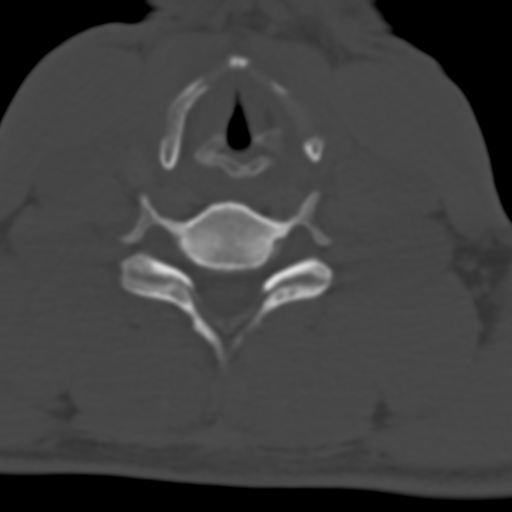
[im 42/83  bone]
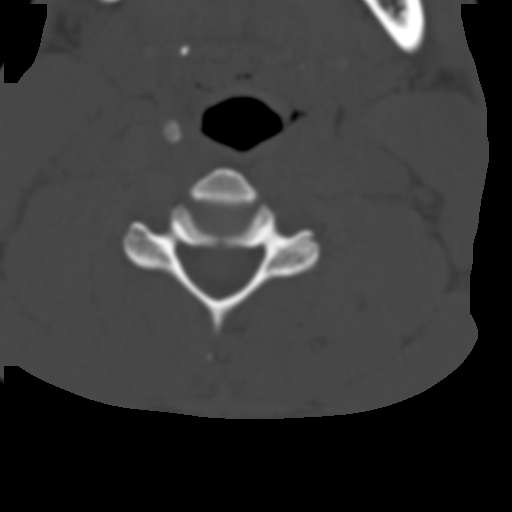
[im 55/83  bone]
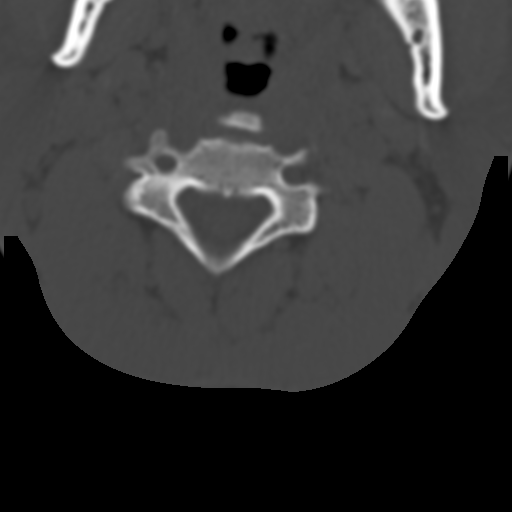
[im 69/83  soft-tissue]
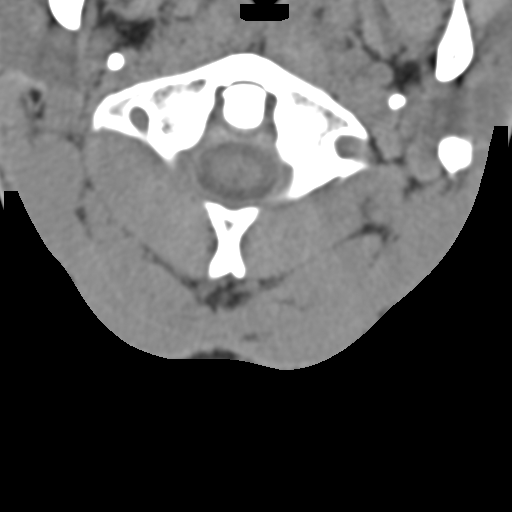
[im 69/83  bone]
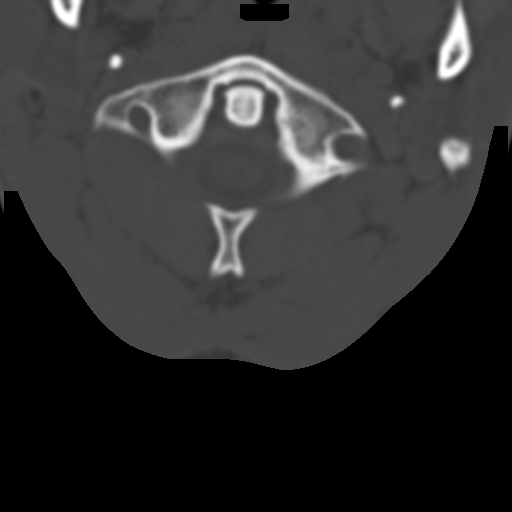

[Series 7: axial reformats · axial · 0.23mm/px · z∈[-172,-69]mm · 5 of 82 slices shown]
[im 14/82  bone]
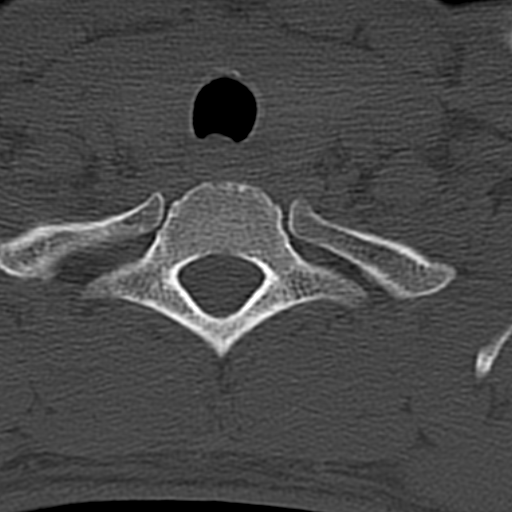
[im 28/82  bone]
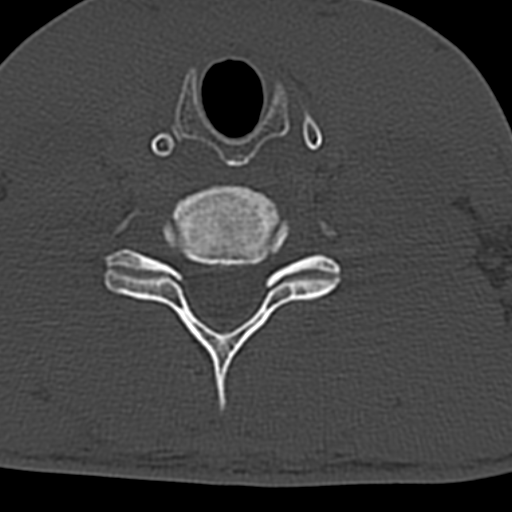
[im 41/82  bone]
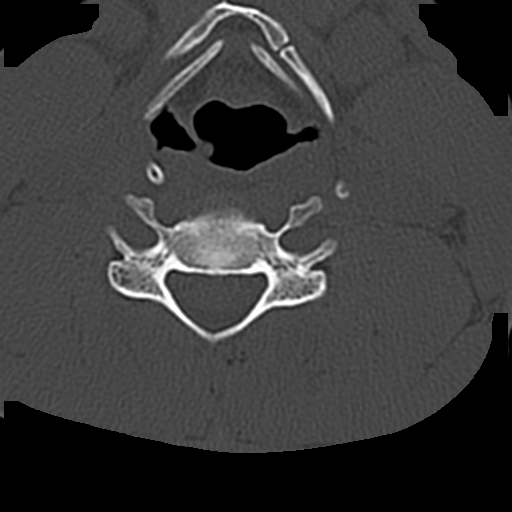
[im 55/82  bone]
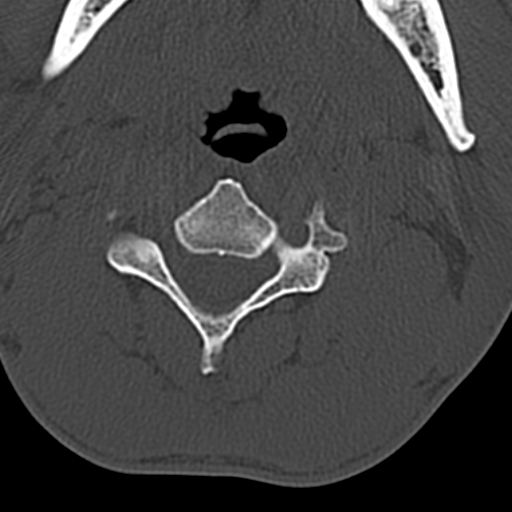
[im 68/82  bone]
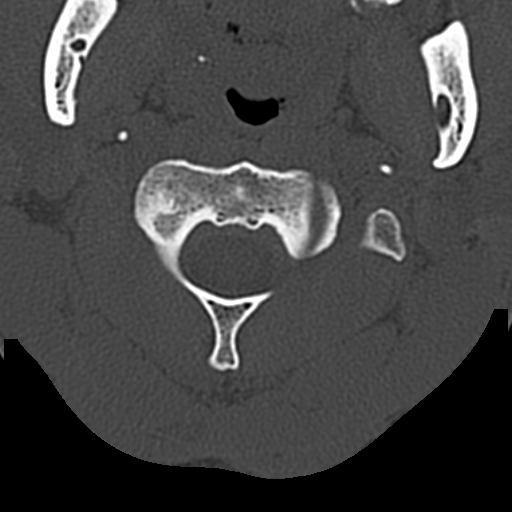

[10 of 14 positions shown; findings below may reference images not displayed]

FINDINGS: Loss of normal cervical lordosis.  No subluxation.  Disc
spaces are maintained.  Minimal degenerative spurring anteriorly C5-
6.  No fracture.  No epidural or paraspinal hematoma.  No neural
foraminal narrowing.
IMPRESSION: No acute bony abnormality.

## 2013-03-06 ENCOUNTER — Ambulatory Visit: Payer: Self-pay | Admitting: Family Medicine

## 2013-03-25 ENCOUNTER — Ambulatory Visit: Payer: Self-pay | Admitting: Family Medicine

## 2014-12-22 ENCOUNTER — Emergency Department (INDEPENDENT_AMBULATORY_CARE_PROVIDER_SITE_OTHER)
Admission: EM | Admit: 2014-12-22 | Discharge: 2014-12-22 | Disposition: A | Discharge status: Home / Self Care | Payer: Self-pay | Source: Home / Self Care | Attending: Emergency Medicine | Admitting: Emergency Medicine

## 2014-12-22 ENCOUNTER — Encounter (HOSPITAL_COMMUNITY): Payer: Self-pay | Admitting: Emergency Medicine

## 2014-12-22 DIAGNOSIS — R1110 Vomiting, unspecified: Secondary | ICD-10-CM

## 2014-12-22 DIAGNOSIS — R197 Diarrhea, unspecified: Secondary | ICD-10-CM

## 2014-12-22 LAB — POCT I-STAT, CHEM 8
BUN: 11 mg/dL (ref 6–23)
CHLORIDE: 104 mmol/L (ref 96–112)
Calcium, Ion: 1.21 mmol/L (ref 1.12–1.23)
Creatinine, Ser: 0.9 mg/dL (ref 0.50–1.35)
Glucose, Bld: 114 mg/dL — ABNORMAL HIGH (ref 70–99)
HCT: 44 % (ref 39.0–52.0)
Hemoglobin: 15 g/dL (ref 13.0–17.0)
POTASSIUM: 4.1 mmol/L (ref 3.5–5.1)
Sodium: 141 mmol/L (ref 135–145)
TCO2: 22 mmol/L (ref 0–100)

## 2014-12-22 LAB — VALPROIC ACID LEVEL

## 2014-12-22 MED ORDER — LOPERAMIDE HCL 2 MG PO CAPS: 2.0000 mg | ORAL_CAPSULE | Freq: Four times a day (QID) | ORAL | Status: DC | PRN

## 2014-12-22 MED ORDER — ONDANSETRON 8 MG PO TBDP: 8.0000 mg | ORAL_TABLET | Freq: Three times a day (TID) | ORAL | Status: DC | PRN

## 2014-12-22 NOTE — Discharge Instructions (Signed)
Tried taking a daily vitamin B-12 supplement and drinking a glass of tonic water before bed to help alleviate nighttime leg cramps.

## 2014-12-22 NOTE — ED Provider Notes (Signed)
CSN: [id number]     Arrival date & time 12/22/14  0945 History   First MD Initiated Contact with Patient 12/22/14 1016     Chief Complaint  Patient presents with  . Diarrhea  . Leg Pain   (Consider location/radiation/quality/duration/timing/severity/associated sxs/prior Treatment) HPI     49 year old male presents for evaluation of general fatigue, nausea, vomiting, diarrhea, bilateral leg cramping. This started 3 days ago. He has tried treating this at home with increasing fluids but he continues to have symptoms. He is worried that he may be dehydrated. Last episodes of vomiting and diarrhea were both this morning. He had a fever at home that has since resolved. No chest pain or shortness of breath.  Past Medical History  Diagnosis Date  . Hyperthyroidism   . Bipolar disorder   . Chest pain   . Anorexia   . Atrial fib/flutter, transient   . Tachyarrhythmia   . Palpitations   . Back pain   . Bradycardia   . Seizures    History reviewed. No pertinent past surgical history. Family History  Problem Relation Age of Onset  . Heart disease Mother   . Diabetes Mother   . Heart failure Mother   . Heart disease Father   . Heart attack Father    History  Substance Use Topics  . Smoking status: Current Every Day Smoker -- 0.25 packs/day    Types: Cigarettes  . Smokeless tobacco: Not on file  . Alcohol Use: 0.6 oz/week    1 Cans of beer per week     Comment: Occasional     Review of Systems  Constitutional: Positive for fever, chills and fatigue.  Respiratory: Negative for cough and shortness of breath.   Cardiovascular: Negative for chest pain and leg swelling.  Gastrointestinal: Positive for nausea, vomiting and diarrhea. Negative for abdominal pain.  Musculoskeletal:       Bilateral leg cramping  Skin: Negative for rash.  Neurological: Negative for seizures and weakness.  All other systems reviewed and are negative.   Allergies  Penicillins  Home Medications    Prior to Admission medications   Medication Sig Start Date End Date Taking? Authorizing Provider  cephALEXin (KEFLEX) 500 MG capsule One PO TID x 7 days 01/12/12   Drucie OpitzBethany Hunt, PA-C  divalproex (DEPAKOTE ER) 500 MG 24 hr tablet Take 1,000 mg by mouth 2 (two) times daily.    Historical Provider, MD  loperamide (IMODIUM) 2 MG capsule Take 1 capsule (2 mg total) by mouth 4 (four) times daily as needed for diarrhea or loose stools. 12/22/14   Graylon GoodZachary H Mylena Sedberry, PA-C  metoprolol (LOPRESSOR) 25 MG tablet Take 1 tablet (25 mg total) by mouth 2 (two) times daily. 02/06/13   Everrett Coombeody Matthews, DO  ondansetron (ZOFRAN ODT) 8 MG disintegrating tablet Take 1 tablet (8 mg total) by mouth every 8 (eight) hours as needed for nausea or vomiting. 12/22/14   Graylon GoodZachary H Wm Fruchter, PA-C  propylthiouracil (PTU) 50 MG tablet Take 2 tablets (100 mg total) by mouth 3 (three) times daily. 02/06/13   Everrett Coombeody Matthews, DO  QUEtiapine (SEROQUEL) 100 MG tablet Take 100 mg by mouth at bedtime.    Historical Provider, MD   BP 149/84 mmHg  Pulse 64  Temp(Src) 97.9 F (36.6 C) (Oral)  Resp 16  SpO2 100% Physical Exam  Constitutional: He is oriented to person, place, and time. He appears well-developed and well-nourished. No distress.  HENT:  Head: Normocephalic.  Eyes: Conjunctivae are normal.  Neck:  Normal range of motion. Neck supple.  Cardiovascular: Normal rate, regular rhythm and normal heart sounds.   Pulmonary/Chest: Effort normal and breath sounds normal. No respiratory distress.  Abdominal: Soft. Bowel sounds are normal. He exhibits no distension and no mass. There is no tenderness. There is no rebound and no guarding.  Lymphadenopathy:    He has no cervical adenopathy.  Neurological: He is alert and oriented to person, place, and time. Coordination normal.  Skin: Skin is warm and dry. No rash noted. He is not diaphoretic.  Psychiatric: He has a normal mood and affect. Judgment normal.  Nursing note and vitals  reviewed.   ED Course  Procedures (including critical care time) Labs Review Labs Reviewed  VALPROIC ACID LEVEL - Abnormal; Notable for the following:    Valproic Acid Lvl <10.0 (*)    All other components within normal limits  POCT I-STAT, CHEM 8 - Abnormal; Notable for the following:    Glucose, Bld 114 (*)    All other components within normal limits    Imaging Review No results found.   MDM   1. Vomiting and diarrhea    His Depakote level is not elevated. His i-STAT is normal. Most likely has viral gastroenteritis. Treat with Zofran and Imodium, increase fluids, follow up when necessary  Meds ordered this encounter  Medications  . ondansetron (ZOFRAN ODT) 8 MG disintegrating tablet    Sig: Take 1 tablet (8 mg total) by mouth every 8 (eight) hours as needed for nausea or vomiting.    Dispense:  12 tablet    Refill:  0  . loperamide (IMODIUM) 2 MG capsule    Sig: Take 1 capsule (2 mg total) by mouth 4 (four) times daily as needed for diarrhea or loose stools.    Dispense:  12 capsule    Refill:  0       Graylon Good, PA-C 12/22/14 1231

## 2014-12-22 NOTE — ED Notes (Signed)
Patient c/o sx including diarrhea, fever, cough and leg cramping x 2 days. Patient reports he thought he was dehydrated and drank a half gallon of water. Patient also has eaten mustard with relief of cramping. Patient is in NAD.

## 2015-01-01 NOTE — ED Notes (Signed)
Valproic acid <10.0.  Discussed with Almedia BallsZach Baker PA and he said no further action needed.  He just wanted to make sure level was not high. Vassie MoselleYork, Ola Raap M 01/01/2015

## 2015-01-26 ENCOUNTER — Emergency Department (HOSPITAL_COMMUNITY)
Admission: EM | Admit: 2015-01-26 | Discharge: 2015-01-26 | Disposition: A | Discharge status: Home / Self Care | Payer: No Typology Code available for payment source | Attending: Emergency Medicine | Admitting: Emergency Medicine

## 2015-01-26 ENCOUNTER — Encounter (HOSPITAL_COMMUNITY): Payer: Self-pay | Admitting: Emergency Medicine

## 2015-01-26 DIAGNOSIS — Z720 Tobacco use: Secondary | ICD-10-CM | POA: Diagnosis not present

## 2015-01-26 DIAGNOSIS — E0590 Thyrotoxicosis, unspecified without thyrotoxic crisis or storm: Secondary | ICD-10-CM | POA: Diagnosis not present

## 2015-01-26 DIAGNOSIS — S79912A Unspecified injury of left hip, initial encounter: Secondary | ICD-10-CM | POA: Insufficient documentation

## 2015-01-26 DIAGNOSIS — Z79899 Other long term (current) drug therapy: Secondary | ICD-10-CM | POA: Diagnosis not present

## 2015-01-26 DIAGNOSIS — Y9389 Activity, other specified: Secondary | ICD-10-CM | POA: Diagnosis not present

## 2015-01-26 DIAGNOSIS — S0990XA Unspecified injury of head, initial encounter: Secondary | ICD-10-CM | POA: Diagnosis not present

## 2015-01-26 DIAGNOSIS — G40909 Epilepsy, unspecified, not intractable, without status epilepticus: Secondary | ICD-10-CM | POA: Diagnosis not present

## 2015-01-26 DIAGNOSIS — S199XXA Unspecified injury of neck, initial encounter: Secondary | ICD-10-CM | POA: Insufficient documentation

## 2015-01-26 DIAGNOSIS — Z880 Allergy status to penicillin: Secondary | ICD-10-CM | POA: Insufficient documentation

## 2015-01-26 DIAGNOSIS — F319 Bipolar disorder, unspecified: Secondary | ICD-10-CM | POA: Diagnosis not present

## 2015-01-26 DIAGNOSIS — Y92481 Parking lot as the place of occurrence of the external cause: Secondary | ICD-10-CM | POA: Diagnosis not present

## 2015-01-26 DIAGNOSIS — Y998 Other external cause status: Secondary | ICD-10-CM | POA: Diagnosis not present

## 2015-01-26 MED ORDER — OXYCODONE HCL 5 MG/5ML PO SOLN: 5.0000 mg | Freq: Four times a day (QID) | ORAL | Status: DC | PRN

## 2015-01-26 MED ORDER — BUPIVACAINE HCL (PF) 0.5 % IJ SOLN
10.0000 mL | Freq: Once | INTRAMUSCULAR | Status: AC
  Administered 2015-01-26: 10 mL
  Filled 2015-01-26: qty 10

## 2015-01-26 MED ORDER — OXYCODONE-ACETAMINOPHEN 5-325 MG PO TABS
1.0000 | ORAL_TABLET | Freq: Once | ORAL | Status: AC
Start: 2015-01-26 — End: 2015-01-26
  Administered 2015-01-26: 1 via ORAL
  Filled 2015-01-26: qty 1
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
## 2015-01-26 NOTE — ED Notes (Signed)
MD Post at bedside.

## 2015-01-26 NOTE — Discharge Instructions (Signed)

## 2016-05-07 ENCOUNTER — Encounter (HOSPITAL_COMMUNITY): Payer: Self-pay | Admitting: Emergency Medicine

## 2016-05-07 ENCOUNTER — Emergency Department (HOSPITAL_COMMUNITY): Payer: Self-pay

## 2016-05-07 ENCOUNTER — Emergency Department (HOSPITAL_COMMUNITY)
Admission: EM | Admit: 2016-05-07 | Discharge: 2016-05-07 | Disposition: A | Discharge status: Home / Self Care | Payer: Self-pay | Attending: Emergency Medicine | Admitting: Emergency Medicine

## 2016-05-07 DIAGNOSIS — F17210 Nicotine dependence, cigarettes, uncomplicated: Secondary | ICD-10-CM | POA: Insufficient documentation

## 2016-05-07 DIAGNOSIS — R0789 Other chest pain: Secondary | ICD-10-CM | POA: Insufficient documentation

## 2016-05-07 DIAGNOSIS — F319 Bipolar disorder, unspecified: Secondary | ICD-10-CM | POA: Insufficient documentation

## 2016-05-07 DIAGNOSIS — K0889 Other specified disorders of teeth and supporting structures: Secondary | ICD-10-CM | POA: Insufficient documentation

## 2016-05-07 DIAGNOSIS — I2510 Atherosclerotic heart disease of native coronary artery without angina pectoris: Secondary | ICD-10-CM | POA: Insufficient documentation

## 2016-05-07 HISTORY — DX: Atherosclerotic heart disease of native coronary artery without angina pectoris: I25.10

## 2016-05-07 HISTORY — DX: Bipolar disorder, unspecified: F31.9

## 2016-05-07 LAB — BASIC METABOLIC PANEL
Anion gap: 6 (ref 5–15)
BUN: 12 mg/dL (ref 6–20)
CHLORIDE: 108 mmol/L (ref 101–111)
CO2: 23 mmol/L (ref 22–32)
CREATININE: 1.04 mg/dL (ref 0.61–1.24)
Calcium: 9.2 mg/dL (ref 8.9–10.3)
GFR calc Af Amer: >60 mL/min (ref >60)
GFR calc non Af Amer: >60 mL/min (ref >60)
GLUCOSE: 109 mg/dL — AB (ref 65–99)
POTASSIUM: 3.9 mmol/L (ref 3.5–5.1)
SODIUM: 137 mmol/L (ref 135–145)

## 2016-05-07 LAB — CBC
HEMATOCRIT: 44.1 % (ref 39.0–52.0)
Hemoglobin: 15.1 g/dL (ref 13.0–17.0)
MCH: 31.8 pg (ref 26.0–34.0)
MCHC: 34.2 g/dL (ref 30.0–36.0)
MCV: 92.8 fL (ref 78.0–100.0)
PLATELETS: 172 10*3/uL (ref 150–400)
RBC: 4.75 MIL/uL (ref 4.22–5.81)
RDW: 13.9 % (ref 11.5–15.5)
WBC: 4.6 10*3/uL (ref 4.0–10.5)

## 2016-05-07 LAB — I-STAT TROPONIN, ED: Troponin i, poc: 0 ng/mL (ref 0.00–0.08)

## 2016-05-07 MED ORDER — ACETAMINOPHEN 500 MG PO TABS
1000.0000 mg | ORAL_TABLET | Freq: Once | ORAL | Status: DC
  Filled 2016-05-07: qty 2

## 2016-05-07 NOTE — ED Notes (Signed)
Pt states that he want this RN to ask the doctor "if he can give me something else for pain because I have been taking tylenol for pain and its not doing anything, if he says no then I will take it but I want you to ask first".

## 2016-05-07 NOTE — ED Provider Notes (Signed)
[facility]-EMERGENCY DEPT Provider Note   CSN: [id number] Arrival date & time: 05/07/16  0830     History   Chief Complaint Chief Complaint  Patient presents with  . Chest Pain  . Dental Pain    HPI Kenneth Jimenez is a 50 y.o. male.  HPI [place] of tooth[place] 1415 for the past 2-3 weeks. He was seen by his dentist a few weeks ago. He had an abscessed and needed repeat evaluation by the dentist. He presents today as he's had insufficient funds to go back to the dentist and toothache continues. He also complains of neck pain and left shoulder pain which is worse with moving, improved with remaining still his head or shoulders also complains of chest pain for the past one week. Feels like "heart attack" he's had in the past although patient has never had cardiac catheterization. Treated with ibuprofen without relief. Chest pain is anterior and chest pain last 1 minute at a time not made worse with exertion comes approximately every 4 minutes. Treated with ibuprofen, without relief. No other associated symptoms  Past Medical History:  Diagnosis Date  . Anorexia   . Atrial fib/flutter, transient   . Back pain   . Bipolar 1 disorder (HCC)   . Bipolar disorder (HCC)   . Bradycardia   . Chest pain   . Coronary artery disease   . Hyperthyroidism   . Palpitations   . Seizures (HCC)   . Tachyarrhythmia     Patient Active Problem List   Diagnosis Date Noted  . Cough 02/06/2013  . Itching 03/21/2011  . Seasonal allergies 12/31/2010  . HYPERTHYROIDISM 09/30/2010  . BIPOLAR DISORDER UNSPECIFIED 09/30/2010  . MYOCARDIAL INFARCTION, HX OF 11/02/2009  . TOBACCO USER 05/30/2009    History reviewed. No pertinent surgical history.     Home Medications    Prior to Admission medications   Medication Sig Start Date End Date Taking? Authorizing Provider  cephALEXin (KEFLEX) 500 MG capsule One PO TID x 7 days 01/12/12   Drucie Opitz, PA-C  divalproex (DEPAKOTE ER) 500 MG 24 hr tablet Take  1,000 mg by mouth 2 (two) times daily.    Historical Provider, MD  loperamide (IMODIUM) 2 MG capsule Take 1 capsule (2 mg total) by mouth 4 (four) times daily as needed for diarrhea or loose stools. 12/22/14   Graylon Good, PA-C  metoprolol (LOPRESSOR) 25 MG tablet Take 1 tablet (25 mg total) by mouth 2 (two) times daily. 02/06/13   Everrett Coombe, DO  ondansetron (ZOFRAN ODT) 8 MG disintegrating tablet Take 1 tablet (8 mg total) by mouth every 8 (eight) hours as needed for nausea or vomiting. 12/22/14   Graylon Good, PA-C  oxyCODONE (ROXICODONE) 5 MG/5ML solution Take 5 mLs (5 mg total) by mouth every 6 (six) hours as needed for severe pain. 01/26/15   Bridgett Larsson, MD  propylthiouracil (PTU) 50 MG tablet Take 2 tablets (100 mg total) by mouth 3 (three) times daily. 02/06/13   Everrett Coombe, DO  QUEtiapine (SEROQUEL) 100 MG tablet Take 100 mg by mouth at bedtime.    Historical Provider, MD    Family History Family History  Problem Relation Age of Onset  . Heart disease Mother   . Diabetes Mother   . Heart failure Mother   . Heart disease Father   . Heart attack Father     Social History Social History  Substance Use Topics  . Smoking status: Current Every Day Smoker    Packs/day:  0.25    Types: Cigarettes  . Smokeless tobacco: Current User  . Alcohol use No     Comment: Occasional      Allergies   Penicillins   Review of Systems Review of Systems  Constitutional: Negative.   HENT: Positive for dental problem.   Respiratory: Negative.   Cardiovascular: Positive for chest pain.       Syncope  Gastrointestinal: Negative.   Musculoskeletal: Negative.   Skin: Negative.   Allergic/Immunologic: Negative.   Neurological: Negative.   Psychiatric/Behavioral: Negative.   All other systems reviewed and are negative.    Physical Exam Updated Vital Signs BP 135/86   Pulse (!) 43   Temp 97.9 F (36.6 C)   Resp 15   Ht 5\' 9"  (1.753 m)   Wt 143 lb (64.9 kg)   SpO2 99%    BMI 21.12 kg/m   Physical Exam  Constitutional: He is oriented to person, place, and time. He appears well-developed and well-nourished. No distress.  HENT:  Head: Normocephalic and atraumatic.  Poor dentition generally. No trismus. No swelling or fluctuance of the gingiva  Eyes: Conjunctivae are normal. Pupils are equal, round, and reactive to light.  Neck: Neck supple. No JVD present. No tracheal deviation present. No thyromegaly present.  No bruit  Cardiovascular: Normal rate and regular rhythm.   No murmur heard. Pulmonary/Chest: Effort normal and breath sounds normal.  Abdominal: Soft. Bowel sounds are normal. He exhibits no distension. There is no tenderness.  Musculoskeletal: Normal range of motion. He exhibits no edema or tenderness.  Lymphadenopathy:    He has no cervical adenopathy.  Neurological: He is alert and oriented to person, place, and time. Coordination normal.  Skin: Skin is warm and dry. No rash noted.  Psychiatric: He has a normal mood and affect.  Nursing note and vitals reviewed.    ED Treatments / Results  Labs (all labs ordered are listed, but only abnormal results are displayed) Labs Reviewed  BASIC METABOLIC PANEL - Abnormal; Notable for the following:       Result Value   Glucose, Bld 109 (*)    All other components within normal limits  CBC  I-STAT TROPOININ, ED    EKG  EKG Interpretation  Date/Time:  Saturday May 07 2016 08:40:43 EDT Ventricular Rate:  63 PR Interval:  158 QRS Duration: 80 QT Interval:  380 QTC Calculation: 388 R Axis:   83 Text Interpretation:  Normal sinus rhythm Right atrial enlargement Left ventricular hypertrophy Abnormal ECG No significant change since last tracing Confirmed by Ethelda Chick  MD, Jessye Imhoff [phone]) on 05/07/2016 8:57:03 AM      Results for orders placed or performed during the hospital encounter of 05/07/16  Basic metabolic panel  Result Value Ref Range   Sodium 137 135 - 145 mmol/L   Potassium 3.9  3.5 - 5.1 mmol/L   Chloride 108 101 - 111 mmol/L   CO2 23 22 - 32 mmol/L   Glucose, Bld 109 (H) 65 - 99 mg/dL   BUN 12 6 - 20 mg/dL   Creatinine, Ser 6.04 0.61 - 1.24 mg/dL   Calcium 9.2 8.9 - 54.0 mg/dL   GFR calc non Af Amer >60 >60 mL/min   GFR calc Af Amer >60 >60 mL/min   Anion gap 6 5 - 15  CBC  Result Value Ref Range   WBC 4.6 4.0 - 10.5 K/uL   RBC 4.75 4.22 - 5.81 MIL/uL   Hemoglobin 15.1 13.0 - 17.0 g/dL  HCT 44.1 39.0 - 52.0 %   MCV 92.8 78.0 - 100.0 fL   MCH 31.8 26.0 - 34.0 pg   MCHC 34.2 30.0 - 36.0 g/dL   RDW 29.513.9 62.111.5 - 30.815.5 %   Platelets 172 150 - 400 K/uL  I-stat troponin, ED  Result Value Ref Range   Troponin i, poc 0.00 0.00 - 0.08 ng/mL   Comment 3           Dg Chest 2 View  Result Date: 05/07/2016 CLINICAL DATA:  Tooth starting 5 days ago with pain radiating to the left arm and left chest. EXAM: CHEST  2 VIEW COMPARISON:  September 11, 2010 FINDINGS: The heart size and mediastinal contours are within normal limits. There is no focal infiltrate, pulmonary edema, or pleural effusion. The visualized skeletal structures are unremarkable. IMPRESSION: No active cardiopulmonary disease. Electronically Signed   By: Sherian ReinWei-Chen  Lin M.D.   On: 05/07/2016 10:00   Radiology No results found.  Procedures Procedures (including critical care time)  Medications Ordered in ED Medications  acetaminophen (TYLENOL) tablet 1,000 mg (not administered)   Chest x-ray viewed by me  Initial Impression / Assessment and Plan / ED Course  I have reviewed the triage vital signs and the nursing notes.  Pertinent labs & imaging results that were available during my care of the patient were reviewed by me and considered in my medical decision making (see chart for details).  Clinical Course   Heart score equals 3. Symptoms highly atypical for acute coronary syndrome. Tylenol for pain. Dental referral. Patient highly argumentative. Counseled patient for 5 minutes on smoking  cessation   Final Clinical Impressions(s) / ED Diagnoses   Final diagnoses:  None  Diagnosis #1 dental pain #2 atypical chest pain  New Prescriptions New Prescriptions   No medications on file     Doug SouSam Stpehanie Montroy, MD 05/07/16 [phone]
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
## 2016-05-07 NOTE — ED Notes (Signed)
Patient transported to X-ray 

## 2016-05-07 NOTE — Discharge Instructions (Signed)
Call Dr. Russella DarBenitez in 2 days to arrange for the next available office visit. Take Tylenol as directed for pain. Ask your primary care physician to help you to stop smoking

## 2016-05-07 NOTE — ED Notes (Signed)
This RN attempted to go over the pts discharge paperwork, the pt refused to allow this RN to properly go over his discharge paperwork, the pt also refused to sign saying that he was given his discharge paperwork.

## 2016-05-07 NOTE — ED Triage Notes (Signed)
Pt. Stated, I've had a toothache and its making my chest hurt. And I've had a a heart attack before. I can' hardly move my neck or shoulder its so bad.  My chest is hurting off and on since Monday.

## 2016-05-07 NOTE — ED Notes (Signed)
[employer] at bedside

## 2016-05-07 NOTE — ED Notes (Signed)
Johnston EbbsKoula RN and Dr. Ethelda ChickJacubowitz at bedside

## 2016-11-06 ENCOUNTER — Ambulatory Visit (HOSPITAL_COMMUNITY)
Admission: EM | Admit: 2016-11-06 | Discharge: 2016-11-06 | Disposition: A | Discharge status: Home / Self Care | Payer: Self-pay | Attending: Family Medicine | Admitting: Family Medicine

## 2016-11-06 ENCOUNTER — Encounter (HOSPITAL_COMMUNITY): Payer: Self-pay | Admitting: *Deleted

## 2016-11-06 DIAGNOSIS — K029 Dental caries, unspecified: Secondary | ICD-10-CM

## 2016-11-06 MED ORDER — IBUPROFEN 800 MG PO TABS: 800.0000 mg | ORAL_TABLET | Freq: Three times a day (TID) | ORAL | 0 refills | Status: DC

## 2016-11-06 MED ORDER — CLINDAMYCIN HCL 300 MG PO CAPS: 300.0000 mg | ORAL_CAPSULE | Freq: Three times a day (TID) | ORAL | 0 refills | Status: DC

## 2016-11-06 NOTE — ED Triage Notes (Signed)
C/O oral pain x 3 days without fevers.  Pain in upper and lower right and left teeth.

## 2016-11-06 NOTE — ED Provider Notes (Signed)
CSN: [id number]     Arrival date & time 11/06/16  1306 History   None    Chief Complaint  Patient presents with  . Dental Pain   (Consider location/radiation/quality/duration/timing/severity/associated sxs/prior Treatment) 51 year old male presents to clinic with a 3 day history of dental pain both upper and lower parts of his mouth. He states he has been taking over-the-counter Tylenol, Advil, and ibuprofen with minimal relief. He denies any fever, or other complaints.   The history is provided by the patient.  Dental Pain    Past Medical History:  Diagnosis Date  . Anorexia   . Atrial fib/flutter, transient   . Back pain   . Bipolar 1 disorder (HCC)   . Bipolar disorder (HCC)   . Bradycardia   . Chest pain   . Coronary artery disease   . Hyperthyroidism   . Palpitations   . Seizures (HCC)   . Tachyarrhythmia    History reviewed. No pertinent surgical history. Family History  Problem Relation Age of Onset  . Heart disease Mother   . Diabetes Mother   . Heart failure Mother   . Heart disease Father   . Heart attack Father    Social History  Substance Use Topics  . Smoking status: Current Every Day Smoker    Packs/day: 0.25    Types: Cigarettes  . Smokeless tobacco: Current User  . Alcohol use No    Review of Systems  Reason unable to perform ROS: as covered in HPI.  All other systems reviewed and are negative.   Allergies  Penicillins  Home Medications   Prior to Admission medications   Medication Sig Start Date End Date Taking? Authorizing Provider  divalproex (DEPAKOTE ER) 500 MG 24 hr tablet Take 1,000 mg by mouth 2 (two) times daily.   Yes Historical Provider, MD  QUEtiapine (SEROQUEL) 100 MG tablet Take 100 mg by mouth at bedtime.   Yes Historical Provider, MD  clindamycin (CLEOCIN) 300 MG capsule Take 1 capsule (300 mg total) by mouth 3 (three) times daily. 11/06/16   Dorena Bodo, NP  ibuprofen (ADVIL,MOTRIN) 800 MG tablet Take 1 tablet (800 mg  total) by mouth 3 (three) times daily. 11/06/16   Dorena Bodo, NP  loperamide (IMODIUM) 2 MG capsule Take 1 capsule (2 mg total) by mouth 4 (four) times daily as needed for diarrhea or loose stools. 12/22/14   Graylon Good, PA-C  oxyCODONE (ROXICODONE) 5 MG/5ML solution Take 5 mLs (5 mg total) by mouth every 6 (six) hours as needed for severe pain. 01/26/15   Bridgett Larsson, MD  propylthiouracil (PTU) 50 MG tablet Take 2 tablets (100 mg total) by mouth 3 (three) times daily. 02/06/13   Everrett Coombe, DO   Meds Ordered and Administered this Visit  Medications - No data to display  There were no vitals taken for this visit. No data found.   Physical Exam  Constitutional: He is oriented to person, place, and time. He appears well-developed and well-nourished. No distress.  HENT:  Head: Normocephalic and atraumatic.  Mouth/Throat: Oropharynx is clear and moist and mucous membranes are normal. Dental caries present. Tonsils are 1+ on the right. Tonsils are 1+ on the left. No tonsillar exudate.  #1 tooth extracted, caries present on the #3, #4, the #8, #14, #17 appears to have been extracted, and a carry on a #19.  Neck: Normal range of motion. Neck supple. No JVD present.  Cardiovascular: Normal rate and regular rhythm.   Pulmonary/Chest: Effort normal  and breath sounds normal.  Lymphadenopathy:       Head (right side): No submental, no submandibular, no tonsillar and no preauricular adenopathy present.       Head (left side): Submandibular adenopathy present. No submental, no tonsillar and no preauricular adenopathy present.    He has no cervical adenopathy.  Neurological: He is alert and oriented to person, place, and time.  Skin: Skin is warm and dry. Capillary refill takes less than 2 seconds. He is not diaphoretic.  Psychiatric: He has a normal mood and affect.  Nursing note and vitals reviewed.   Urgent Care Course     Procedures (including critical care time)  Labs Review Labs  Reviewed - No data to display  Imaging Review No results found.   Visual Acuity Review    MDM   1. Dental caries    You have multiple infected dental caries, I prescribed an antibiotic, clindamycin, take one tablet 3 times a day for 7 days. For pain I prescribed 800 mg ibuprofen. Take one tablet every 8 hours as needed. You will need to follow-up with a dentist for further care for your teeth, your symptoms will return without proper dental care.      Dorena BodoLawrence Phung Kotas, NP 11/06/16 1527

## 2016-11-06 NOTE — Discharge Instructions (Signed)
You have multiple infected dental caries, I prescribed an antibiotic, clindamycin, take one tablet 3 times a day for 7 days. For pain I prescribed 800 mg ibuprofen. Take one tablet every 8 hours as needed. You will need to follow-up with a dentist for further care for your teeth, your symptoms will return without proper dental care.

## 2017-02-03 ENCOUNTER — Encounter (HOSPITAL_COMMUNITY): Payer: Self-pay | Admitting: Emergency Medicine

## 2017-02-03 ENCOUNTER — Ambulatory Visit (HOSPITAL_COMMUNITY)
Admission: EM | Admit: 2017-02-03 | Discharge: 2017-02-03 | Disposition: A | Discharge status: Home / Self Care | Payer: Self-pay | Attending: Internal Medicine | Admitting: Internal Medicine

## 2017-02-03 DIAGNOSIS — K0889 Other specified disorders of teeth and supporting structures: Secondary | ICD-10-CM

## 2017-02-03 DIAGNOSIS — K029 Dental caries, unspecified: Secondary | ICD-10-CM

## 2017-02-03 DIAGNOSIS — S76311A Strain of muscle, fascia and tendon of the posterior muscle group at thigh level, right thigh, initial encounter: Secondary | ICD-10-CM

## 2017-02-03 MED ORDER — SULFAMETHOXAZOLE-TRIMETHOPRIM 800-160 MG PO TABS: 1.0000 | ORAL_TABLET | Freq: Two times a day (BID) | ORAL | 0 refills | Status: DC

## 2017-02-03 MED ORDER — KETOROLAC TROMETHAMINE 60 MG/2ML IM SOLN
60.0000 mg | Freq: Once | INTRAMUSCULAR | Status: AC
  Administered 2017-02-03: 60 mg via INTRAMUSCULAR

## 2017-02-03 MED ORDER — SULFAMETHOXAZOLE-TRIMETHOPRIM 800-160 MG PO TABS: 1.0000 | ORAL_TABLET | Freq: Two times a day (BID) | ORAL | 0 refills | Status: AC

## 2017-02-03 MED ORDER — IBUPROFEN 800 MG PO TABS: 800.0000 mg | ORAL_TABLET | Freq: Three times a day (TID) | ORAL | 0 refills | Status: DC

## 2017-02-03 MED ORDER — KETOROLAC TROMETHAMINE 60 MG/2ML IM SOLN
INTRAMUSCULAR | Status: AC
  Filled 2017-02-03: qty 2

## 2017-02-03 NOTE — ED Provider Notes (Signed)
CSN: [id number]     Arrival date & time 02/03/17  1048 History   First MD Initiated Contact with Patient 02/03/17 1135     Chief Complaint  Patient presents with  . Dental Pain   (Consider location/radiation/quality/duration/timing/severity/associated sxs/prior Treatment) Patient c/o dental pain and right thigh discomfort and swelling from a muscle cramp.   The history is provided by the patient.  Dental Pain  Location:  Upper and lower Upper teeth location:  1/RU 3rd molar Lower teeth location:  30/RL 1st molar Quality:  Aching Severity:  Moderate Onset quality:  Sudden Duration:  3 days Timing:  Constant Chronicity:  New Context: abscess and dental caries   Relieved by:  Nothing Worsened by:  Nothing Ineffective treatments:  None tried   Past Medical History:  Diagnosis Date  . Anorexia   . Atrial fib/flutter, transient   . Back pain   . Bipolar 1 disorder (HCC)   . Bipolar disorder (HCC)   . Bradycardia   . Chest pain   . Coronary artery disease   . Hyperthyroidism   . Palpitations   . Seizures (HCC)   . Tachyarrhythmia    History reviewed. No pertinent surgical history. Family History  Problem Relation Age of Onset  . Heart disease Mother   . Diabetes Mother   . Heart failure Mother   . Heart disease Father   . Heart attack Father    Social History  Substance Use Topics  . Smoking status: Current Every Day Smoker    Packs/day: 0.25    Types: Cigarettes  . Smokeless tobacco: Current User  . Alcohol use No    Review of Systems  Constitutional: Negative.   HENT: Positive for dental problem.   Eyes: Negative.   Respiratory: Negative.   Cardiovascular: Negative.   Gastrointestinal: Negative.   Endocrine: Negative.   Genitourinary: Negative.   Musculoskeletal: Negative.   Skin: Negative.   Allergic/Immunologic: Negative.   Neurological: Negative.   Hematological: Negative.   Psychiatric/Behavioral: Negative.     Allergies   Penicillins  Home Medications   Prior to Admission medications   Medication Sig Start Date End Date Taking? Authorizing Provider  ibuprofen (ADVIL,MOTRIN) 800 MG tablet Take 1 tablet (800 mg total) by mouth 3 (three) times daily. 02/03/17   Deatra Canterxford, Laquinton Bihm J, FNP  sulfamethoxazole-trimethoprim (BACTRIM DS,SEPTRA DS) 800-160 MG tablet Take 1 tablet by mouth 2 (two) times daily. 02/03/17 02/10/17  Deatra Canterxford, Shaquel Josephson J, FNP   Meds Ordered and Administered this Visit   Medications  ketorolac (TORADOL) injection 60 mg (60 mg Intramuscular Given 02/03/17 1156)    BP 120/71 (BP Location: Right Arm)   Pulse 63   Temp 98.1 F (36.7 C) (Oral)   Resp 18   SpO2 98%  No data found.   Physical Exam  Constitutional: He appears well-developed and well-nourished.  HENT:  Head: Normocephalic and atraumatic.  Right Ear: External ear normal.  Left Ear: External ear normal.  Mouth/Throat: Oropharynx is clear and moist.  Eyes: Conjunctivae and EOM are normal. Pupils are equal, round, and reactive to light.  Neck: Normal range of motion.  Cardiovascular: Normal rate, regular rhythm and normal heart sounds.   Pulmonary/Chest: Effort normal and breath sounds normal.  Abdominal: Soft. Bowel sounds are normal.  Nursing note and vitals reviewed.   Urgent Care Course     Procedures (including critical care time)  Labs Review Labs Reviewed - No data to display  Imaging Review No results found.   Visual  Acuity Review  Right Eye Distance:   Left Eye Distance:   Bilateral Distance:    Right Eye Near:   Left Eye Near:    Bilateral Near:         MDM   1. Pain, dental   2. Dental caries   3. Right hamstring muscle strain, initial encounter    Bactrim DS 800mg  one po tid x 10 days #20 Motrin 800mg  one po tid prn #21     Deatra Canter, FNP 02/03/17 1236

## 2017-02-03 NOTE — ED Triage Notes (Addendum)
The patient presented to the [facility] with a complaint of dental pain x 2 months. The patient also complained of right leg pain.

## 2017-02-20 ENCOUNTER — Encounter (HOSPITAL_COMMUNITY): Payer: Self-pay | Admitting: Emergency Medicine

## 2017-02-20 ENCOUNTER — Ambulatory Visit (HOSPITAL_COMMUNITY)
Admission: EM | Admit: 2017-02-20 | Discharge: 2017-02-20 | Disposition: A | Discharge status: Home / Self Care | Payer: Self-pay | Attending: Internal Medicine | Admitting: Internal Medicine

## 2017-02-20 DIAGNOSIS — K0889 Other specified disorders of teeth and supporting structures: Secondary | ICD-10-CM

## 2017-02-20 MED ORDER — HYDROCODONE-ACETAMINOPHEN 5-325 MG PO TABS: 1.0000 | ORAL_TABLET | ORAL | 0 refills | Status: DC | PRN

## 2017-02-20 MED ORDER — CLINDAMYCIN HCL 300 MG PO CAPS: 300.0000 mg | ORAL_CAPSULE | Freq: Three times a day (TID) | ORAL | 0 refills | Status: DC

## 2017-02-20 MED ORDER — HYDROCODONE-ACETAMINOPHEN 5-325 MG PO TABS
1.0000 | ORAL_TABLET | Freq: Once | ORAL | Status: AC
  Administered 2017-02-20: 1 via ORAL

## 2017-02-20 MED ORDER — HYDROCODONE-ACETAMINOPHEN 5-325 MG PO TABS
ORAL_TABLET | ORAL | Status: AC
Start: 2017-02-20 — End: 2017-02-20
  Filled 2017-02-20: qty 1

## 2017-02-20 NOTE — ED Provider Notes (Signed)
CSN: [id number]     Arrival date & time 02/20/17  1727 History   First MD Initiated Contact with Patient 02/20/17 1958     Chief Complaint  Patient presents with  . Dental Pain   (Consider location/radiation/quality/duration/timing/severity/associated sxs/prior Treatment) 51 year old male presents to the urgent care with a toothache. He has been to the urgent care and emergency department 3-4 times in the past few weeks. He states that at some point he was given antibiotics and went to the dentist but states he was still having swelling and he cannot perform any surgery on him at that time. Complaining of pain in the left lower third molar. The pain involves the right side of the face and goes to the right temporoparietal scalp.      Past Medical History:  Diagnosis Date  . Anorexia   . Atrial fib/flutter, transient   . Back pain   . Bipolar 1 disorder (HCC)   . Bipolar disorder (HCC)   . Bradycardia   . Chest pain   . Coronary artery disease   . Hyperthyroidism   . Palpitations   . Seizures (HCC)   . Tachyarrhythmia    History reviewed. No pertinent surgical history. Family History  Problem Relation Age of Onset  . Heart disease Mother   . Diabetes Mother   . Heart failure Mother   . Heart disease Father   . Heart attack Father    Social History  Substance Use Topics  . Smoking status: Current Every Day Smoker    Packs/day: 0.25    Types: Cigarettes  . Smokeless tobacco: Current User  . Alcohol use No    Review of Systems  HENT: Positive for dental problem and facial swelling.   Eyes: Negative.   Gastrointestinal: Negative.   All other systems reviewed and are negative.   Allergies  Penicillins  Home Medications   Prior to Admission medications   Medication Sig Start Date End Date Taking? Authorizing Provider  ibuprofen (ADVIL,MOTRIN) 800 MG tablet Take 1 tablet (800 mg total) by mouth 3 (three) times daily. 02/03/17  Yes Deatra Canterxford, William J, FNP   clindamycin (CLEOCIN) 300 MG capsule Take 1 capsule (300 mg total) by mouth 3 (three) times daily. 02/20/17   Hayden RasmussenMabe, Kateleen Encarnacion, NP  HYDROcodone-acetaminophen (NORCO/VICODIN) 5-325 MG tablet Take 1 tablet by mouth every 4 (four) hours as needed. 02/20/17   Hayden RasmussenMabe, Melaina Howerton, NP   Meds Ordered and Administered this Visit  Medications - No data to display  BP 131/68 (BP Location: Right Arm)   Pulse 75   Temp 98.5 F (36.9 C) (Oral)   Resp (!) 22   SpO2 100%  No data found.   Physical Exam  Constitutional: He is oriented to person, place, and time. He appears well-developed and well-nourished. No distress.  HENT:  Tenderness to the right lower third molar and second molar. No swelling or abscess formation is seen. Tenderness to the buccal mucosa as well as the right side of the face and temporoparietal area. No swelling is appreciated. No abscess formation seen.  Eyes: EOM are normal.  Neck: Normal range of motion. Neck supple.  Neurological: He is alert and oriented to person, place, and time.  Skin: Skin is warm and dry.  Nursing note and vitals reviewed.   Urgent Care Course     Procedures (including critical care time)  Labs Review Labs Reviewed - No data to display  Imaging Review No results found.   Visual Acuity Review  Right  Eye Distance:   Left Eye Distance:   Bilateral Distance:    Right Eye Near:   Left Eye Near:    Bilateral Near:         MDM   1. Pain, dental    Meds as directed. Follow-up with your dentist is sent is possible. If unable to follow dentist you may follow up with her primary care provider for medications if needed. Meds ordered this encounter  Medications  . clindamycin (CLEOCIN) 300 MG capsule    Sig: Take 1 capsule (300 mg total) by mouth 3 (three) times daily.    Dispense:  21 capsule    Refill:  0    Order Specific Question:   Supervising Provider    Answer:   Eustace Moore [[id number]]  . HYDROcodone-acetaminophen (NORCO/VICODIN) 5-325  MG tablet    Sig: Take 1 tablet by mouth every 4 (four) hours as needed.    Dispense:  5 tablet    Refill:  0    Order Specific Question:   Supervising Provider    Answer:   Eustace Moore [[id number]]       Hayden Rasmussen, NP 02/20/17 2018

## 2017-02-20 NOTE — ED Triage Notes (Signed)
Bottom, right tooth pain.  Patient has pain in right face, head, ear.  History of pain with this tooth.  Patient says swelling and pain never did resolve, so he says dentist would not see him.  Patient reports 2 day history of pain.

## 2017-02-20 NOTE — Discharge Instructions (Signed)
Meds as directed. Follow-up with your dentist is sent is possible. If unable to follow dentist you may follow up with her primary care provider for medications if needed.

## 2017-02-21 ENCOUNTER — Telehealth (HOSPITAL_COMMUNITY): Payer: Self-pay | Admitting: Emergency Medicine

## 2017-02-21 MED ORDER — SULFAMETHOXAZOLE-TRIMETHOPRIM 800-160 MG PO TABS: 1.0000 | ORAL_TABLET | Freq: Two times a day (BID) | ORAL | 0 refills | Status: AC

## 2017-02-21 NOTE — Telephone Encounter (Signed)
Pt called stating he was seen yest for dental pain and was given Clindamycin since he is allergic to PCN  Sts he can not afford Clindamycin b/c it's $60  Wants to know if we can call in Bactrim given to him on 5/25 for similar sx  Per Leonie GreenBill O, NP... Ok to call in Rx... 1 tab PO BID x10 days #20 no refills  Per pt's request... [facility]  Notified pt and adv him to f/u w/dentist  Pt verb understanding.

## 2018-08-23 ENCOUNTER — Other Ambulatory Visit: Payer: Self-pay

## 2018-08-23 ENCOUNTER — Encounter (HOSPITAL_COMMUNITY): Payer: Self-pay | Admitting: Emergency Medicine

## 2018-08-23 ENCOUNTER — Emergency Department (HOSPITAL_COMMUNITY)
Admission: EM | Admit: 2018-08-23 | Discharge: 2018-08-23 | Disposition: A | Discharge status: Home / Self Care | Payer: Self-pay | Attending: Emergency Medicine | Admitting: Emergency Medicine

## 2018-08-23 DIAGNOSIS — K0889 Other specified disorders of teeth and supporting structures: Secondary | ICD-10-CM | POA: Insufficient documentation

## 2018-08-23 DIAGNOSIS — I2510 Atherosclerotic heart disease of native coronary artery without angina pectoris: Secondary | ICD-10-CM | POA: Insufficient documentation

## 2018-08-23 DIAGNOSIS — Z79899 Other long term (current) drug therapy: Secondary | ICD-10-CM | POA: Insufficient documentation

## 2018-08-23 DIAGNOSIS — F17210 Nicotine dependence, cigarettes, uncomplicated: Secondary | ICD-10-CM | POA: Insufficient documentation

## 2018-08-23 MED ORDER — AMOXICILLIN 500 MG PO CAPS: 500.0000 mg | ORAL_CAPSULE | Freq: Three times a day (TID) | ORAL | 0 refills | Status: DC

## 2018-08-23 NOTE — ED Provider Notes (Signed)
[facility] Provider Note   CSN: [id number] Arrival date & time: 08/23/18  0530    History   Chief Complaint Chief Complaint  Patient presents with  . Dental Injury    HPI Kenneth Jimenez is a 52 y.o. male who presents with right sided dental pain. PMH significant for A.fib, bipolar d/o, CAD. It started around 2PM yesterday. He states it's over the top right posterior tooth and bottom right posterior tooth. The pain radiates to his head. It is severe and constant. It's worse with eating. He's been taking Ibuprofen and using Oragel without relief. He doesn't have a dentist. No fever or inability to swallow.  HPI  Past Medical History:  Diagnosis Date  . Anorexia   . Atrial fib/flutter, transient   . Back pain   . Bipolar 1 disorder (HCC)   . Bipolar disorder (HCC)   . Bradycardia   . Chest pain   . Coronary artery disease   . Hyperthyroidism   . Palpitations   . Seizures (HCC)   . Tachyarrhythmia     Patient Active Problem List   Diagnosis Date Noted  . Cough 02/06/2013  . Itching 03/21/2011  . Seasonal allergies 12/31/2010  . HYPERTHYROIDISM 09/30/2010  . BIPOLAR DISORDER UNSPECIFIED 09/30/2010  . MYOCARDIAL INFARCTION, HX OF 11/02/2009  . TOBACCO USER 05/30/2009    History reviewed. No pertinent surgical history.      Home Medications    Prior to Admission medications   Medication Sig Start Date End Date Taking? Authorizing Provider  clindamycin (CLEOCIN) 300 MG capsule Take 1 capsule (300 mg total) by mouth 3 (three) times daily. 02/20/17   Hayden RasmussenMabe, David, NP  HYDROcodone-acetaminophen (NORCO/VICODIN) 5-325 MG tablet Take 1 tablet by mouth every 4 (four) hours as needed. 02/20/17   Hayden RasmussenMabe, David, NP  ibuprofen (ADVIL,MOTRIN) 800 MG tablet Take 1 tablet (800 mg total) by mouth 3 (three) times daily. 02/03/17   Deatra Canterxford, William J, FNP    Family History Family History  Problem Relation Age of Onset  . Heart disease Mother     . Diabetes Mother   . Heart failure Mother   . Heart disease Father   . Heart attack Father     Social History Social History   Tobacco Use  . Smoking status: Current Every Day Smoker    Packs/day: 0.25    Types: Cigarettes  . Smokeless tobacco: Current User  Substance Use Topics  . Alcohol use: No  . Drug use: Yes    Types: Marijuana     Allergies   Penicillins   Review of Systems Review of Systems  Constitutional: Negative for fever.  HENT: Positive for dental problem.      Physical Exam Updated Vital Signs BP (!) 149/85 (BP Location: Right Arm)   Pulse 72   Temp 98.6 F (37 C) (Oral)   Resp 16   Ht 5\' 8"  (1.727 m)   Wt 66.7 kg   SpO2 99%   BMI 22.35 kg/m   Physical Exam Vitals signs and nursing note reviewed.  Constitutional:      General: He is not in acute distress.    Appearance: Normal appearance. He is well-developed.  HENT:     Head: Normocephalic and atraumatic.     Jaw: No trismus.     Mouth/Throat:     Mouth: Mucous membranes are moist.     Dentition: Abnormal dentition (multiple caries). Dental tenderness and dental caries present. No dental abscesses.  Pharynx: Oropharynx is clear.  Eyes:     General: No scleral icterus.       Right eye: No discharge.        Left eye: No discharge.     Conjunctiva/sclera: Conjunctivae normal.     Pupils: Pupils are equal, round, and reactive to light.  Neck:     Musculoskeletal: Normal range of motion.  Cardiovascular:     Rate and Rhythm: Normal rate.  Pulmonary:     Effort: Pulmonary effort is normal. No respiratory distress.  Abdominal:     General: There is no distension.  Skin:    General: Skin is warm and dry.  Neurological:     Mental Status: He is alert and oriented to person, place, and time.  Psychiatric:        Behavior: Behavior normal.      ED Treatments / Results  Labs (all labs ordered are listed, but only abnormal results are displayed) Labs Reviewed - No data to  display  EKG None  Radiology No results found.  Procedures .Nerve Block Date/Time: 08/23/2018 7:05 AM Performed by: Bethel Born, PA-C Authorized by: Bethel Born, PA-C   Consent:    Consent obtained:  Verbal   Consent given by:  Patient   Risks discussed:  Pain and unsuccessful block   Alternatives discussed:  No treatment Indications:    Indications:  Pain relief Skin anesthesia (see MAR for exact dosages):    Skin anesthesia method:  None Procedure details (see MAR for exact dosages):    Block needle gauge:  27 G   Anesthetic injected:  Bupivacaine 0.5% WITH epi   Steroid injected:  None   Injection procedure:  Anatomic landmarks identified, incremental injection, introduced needle, anatomic landmarks palpated and negative aspiration for blood   Paresthesia:  None Post-procedure details:    Outcome:  Pain improved   Patient tolerance of procedure:  Tolerated well, no immediate complications   (including critical care time)    Medications Ordered in ED Medications - No data to display   Initial Impression / Assessment and Plan / ED Course  I have reviewed the triage vital signs and the nursing notes.  Pertinent labs & imaging results that were available during my care of the patient were reviewed by me and considered in my medical decision making (see chart for details).  Dental pain associated with dental caries and possible dental infection. Patient is afebrile, non toxic appearing, and swallowing secretions well. No concerning findings on exam. No obvious abscess and doubt deep space head or neck infection.  I gave patient referral to dentist and stressed the importance of dental follow up for ultimate management of dental pain. He was given a dental block. He was given rx for Amoxil and advised to f/u with Dr. Lucky Cowboy who is on call.   Final Clinical Impressions(s) / ED Diagnoses   Final diagnoses:  Pain, dental    ED Discharge Orders    None         Bethel Born, PA-C 08/23/18 0706    Ward, Layla Maw, DO 08/23/18 [phone]

## 2018-08-23 NOTE — Discharge Instructions (Addendum)
Start Amoxil 3 times daily Continue Ibuprofen for pain Please follow up with Dr. Lucky CowboyKnox (dentist) Return if worsening

## 2018-08-23 NOTE — ED Notes (Signed)
PT states understanding of care given, follow up care, and medication prescribed. PT ambulated from ED to car with a steady gait. 

## 2018-08-23 NOTE — ED Triage Notes (Signed)
C/O of 10/10 right sided tooth pain since 1400 yesterday with no relief. Pt states he's had infection on same side before and it feels the same.

## 2019-04-01 ENCOUNTER — Other Ambulatory Visit: Payer: Self-pay

## 2019-04-01 ENCOUNTER — Encounter (HOSPITAL_COMMUNITY): Payer: Self-pay | Admitting: Emergency Medicine

## 2019-04-01 ENCOUNTER — Emergency Department (HOSPITAL_COMMUNITY)
Admission: EM | Admit: 2019-04-01 | Discharge: 2019-04-01 | Disposition: A | Discharge status: Home / Self Care | Payer: Self-pay | Attending: Emergency Medicine | Admitting: Emergency Medicine

## 2019-04-01 DIAGNOSIS — F17210 Nicotine dependence, cigarettes, uncomplicated: Secondary | ICD-10-CM | POA: Insufficient documentation

## 2019-04-01 DIAGNOSIS — K029 Dental caries, unspecified: Secondary | ICD-10-CM | POA: Insufficient documentation

## 2019-04-01 DIAGNOSIS — K0889 Other specified disorders of teeth and supporting structures: Secondary | ICD-10-CM | POA: Insufficient documentation

## 2019-04-01 MED ORDER — AMOXICILLIN-POT CLAVULANATE 875-125 MG PO TABS: 1.0000 | ORAL_TABLET | Freq: Two times a day (BID) | ORAL | 0 refills | Status: AC

## 2019-04-01 MED ORDER — CHLORHEXIDINE GLUCONATE 0.12 % MT SOLN: 15.0000 mL | Freq: Two times a day (BID) | OROMUCOSAL | 0 refills | Status: AC

## 2019-04-01 NOTE — ED Notes (Signed)
Patient verbalizes understanding of discharge instructions. Opportunity for questioning and answers were provided. Armband removed by staff, pt discharged from ED.  

## 2019-04-01 NOTE — Discharge Instructions (Signed)
Please see the information and instructions below regarding your visit.  Your diagnoses today include:  1. Pain, dental    You have a dental infection. It is very important that you get evaluated by a dentist as soon as possible. Call tomorrow to schedule an appointment. Tylenol as needed for pain. Take your full course of antibiotics. Read the instructions below.  Tests performed today include: See side panel of your discharge paperwork for testing performed today. Vital signs are listed at the bottom of these instructions.   Medications prescribed:    Take any prescribed medications only as prescribed, and any over the counter medications only as directed on the packaging.  1. You are prescribed Augmentin, an antibiotic. Please take all of your antibiotics until finished.   You may develop abdominal discomfort or nausea from the antibiotic. If this occurs, you may take it with food. Some patients also get diarrhea with antibiotics. You may help offset this with probiotics which you can buy or get in yogurt. Do not eat or take the probiotics until 2 hours after your antibiotic.   Some people develop allergies to antibiotics. Symptoms of antibiotic allergy can be mild and include a flat rash and itching. They can also be more serious and include:  ?Hives - Hives are raised, red patches of skin that are usually very itchy.  ?Lip or tongue swelling  ?Trouble swallowing or breathing  ?Blistering of the skin or mouth.  If you have any of these serious symptoms, please seek emergency medical care immediately.  2.  I recommend Tylenol, 650 mg every 6 hours as needed for pain.  3. You are prescribed chlorhexidine solution swish and spit twice daily. Do not swallow. This is to clear bacteria from your mouth.    Home care instructions:  Please follow any educational materials contained in this packet.   Eat a soft or liquid diet and rinse your mouth out after meals with warm water. You  should see a dentist or return here at once if you have increased swelling, increased pain or uncontrolled bleeding from the site of your injury.  Follow-up instructions: It is very important that you see a dentist as soon as possible. There is a list of dentists attached to this packet if you do not have care established with a dentist already. Please give a call to a dentist of your choice tomorrow.  Return instructions:  Please return to the Emergency Department if you experience worsening symptoms.  Please seek care if you note any of the following about your dental pain:  You have increased pain not controlled with medicines.  You have swelling around your tooth, in your face or neck.  You have bleeding which starts, continues, or gets worse.  You have a fever >101 If you are unable to open your mouth Please return if you have any other emergent concerns.  Additional Information:   Your vital signs today were: BP (!) 135/99 (BP Location: Right Arm)    Pulse 74    Temp 98.8 F (37.1 C) (Oral)    Resp 16    SpO2 97%  If your blood pressure (BP) was elevated on multiple readings during this visit above 130 for the top number or above 80 for the bottom number, please have this repeated by your primary care provider within one month. --------------  Thank you for allowing [place] to participate in your care today.

## 2019-04-01 NOTE — ED Provider Notes (Signed)
[facility] EMERGENCY DEPARTMENT Provider Note   CSN: [id number] Arrival date & time: 04/01/19  2423     History   Chief Complaint Chief Complaint  Patient presents with  . Dental Pain    HPI Kenneth Jimenez is a 53 y.o. male.     HPI    Kenneth Jimenez is a 53 y.o. male with a PMH of atrial fib/flutter (not on [facility]), bipolar 1 disorder, hyperthyroidism who presents to the Emergency Department complaining of persistent, gradually worsening, right-sided, upper dental pain beginning yesterday.  Patient has had ongoing issues with this tooth but dentistry did not have any appointments before the pandemic. Pt describes their pain as throbbing. Pt denies any home remedies. They are not currently followed by dentistry.  Pt reports that he may have had facial swelling yesterday. Pt denies fever, chills, difficulty breathing, difficulty swallowing.   Past Medical History:  Diagnosis Date  . Anorexia   . Atrial fib/flutter, transient   . Back pain   . Bipolar 1 disorder ([place])   . Bipolar disorder ([place])   . Bradycardia   . Chest pain   . Coronary artery disease   . Hyperthyroidism   . Palpitations   . Seizures ([place])   . Tachyarrhythmia     Patient Active Problem List   Diagnosis Date Noted  . Cough 02/06/2013  . Itching 03/21/2011  . Seasonal allergies 12/31/2010  . HYPERTHYROIDISM 09/30/2010  . BIPOLAR DISORDER UNSPECIFIED 09/30/2010  . MYOCARDIAL INFARCTION, HX OF 11/02/2009  . TOBACCO USER 05/30/2009    History reviewed. No pertinent surgical history.      Home Medications    Prior to Admission medications   Medication Sig Start Date End Date Taking? Authorizing Provider  amoxicillin (AMOXIL) 500 MG capsule Take 1 capsule (500 mg total) by mouth 3 (three) times daily. 08/23/18   Recardo Evangelist, PA-C    Family History Family History  Problem Relation Age of Onset  . Heart disease Mother   . Diabetes Mother   . Heart failure Mother   .  Heart disease Father   . Heart attack Father     Social History Social History   Tobacco Use  . Smoking status: Current Every Day Smoker    Packs/day: 0.25    Types: Cigarettes  . Smokeless tobacco: Current User  Substance Use Topics  . Alcohol use: No  . Drug use: Yes    Types: Marijuana     Allergies   Penicillins   Review of Systems Review of Systems  Constitutional: Negative for chills and fever.  HENT: Positive for dental problem. Negative for facial swelling and trouble swallowing.   Respiratory: Negative for shortness of breath and stridor.      Physical Exam Updated Vital Signs BP (!) 135/99 (BP Location: Right Arm)   Pulse 74   Temp 98.8 F (37.1 C) (Oral)   Resp 16   SpO2 97%   Physical Exam Vitals signs and nursing note reviewed.  Constitutional:      General: He is not in acute distress.    Appearance: He is well-developed. He is not diaphoretic.     Comments: Sitting comfortably in bed.  HENT:     Head: Normocephalic and atraumatic.     Mouth/Throat:      Comments: Dental cavities and poor oral dentition noted. Pain along gumline along right upper molars as depicted in image. No abscess noted. Midline uvula. No trismus. OP clear and moist. No oropharyngeal  erythema or edema. Neck supple with no tenderness. No facial edema. Eyes:     General:        Right eye: No discharge.        Left eye: No discharge.     Conjunctiva/sclera: Conjunctivae normal.     Comments: EOMs normal to gross examination.  Neck:     Musculoskeletal: Normal range of motion.  Cardiovascular:     Rate and Rhythm: Normal rate and regular rhythm.     Comments: Intact, 2+ radial pulse. Pulmonary:     Comments: Converses comfortably.  No audible wheeze or stridor. Abdominal:     General: There is no distension.  Musculoskeletal: Normal range of motion.  Skin:    General: Skin is warm and dry.  Neurological:     Mental Status: He is alert.     Comments: Cranial nerves  intact to gross observation. Patient moves extremities without difficulty.  Psychiatric:        Behavior: Behavior normal.        Thought Content: Thought content normal.        Judgment: Judgment normal.      ED Treatments / Results  Labs (all labs ordered are listed, but only abnormal results are displayed) Labs Reviewed - No data to display  EKG None  Radiology No results found.  Procedures Procedures (including critical care time)  Medications Ordered in ED Medications - No data to display   Initial Impression / Assessment and Plan / ED Course  I have reviewed the triage vital signs and the nursing notes.  Pertinent labs & imaging results that were available during my care of the patient were reviewed by me and considered in my medical decision making (see chart for details).        Lonia BloodGregory Vorce is a 53 y.o. male who presents to ED for dental pain. No abscess requiring immediate incision and drainage. Patient is afebrile, non toxic appearing, and swallowing secretions well. Exam not concerning for Ludwig's angina or pharyngeal abscess. Will treat with Augmentin, peridex rinse.  Reports that he previously tolerated amoxicillin well despite his penicillin allergy noted in chart.  We will proceed with Augmentin.  I provided dental resource guide and stressed the importance of dental follow up for ultimate management of dental pain. Patient voices understanding and is agreeable to plan.  Final Clinical Impressions(s) / ED Diagnoses   Final diagnoses:  Pain, dental    ED Discharge Orders         Ordered    amoxicillin-clavulanate (AUGMENTIN) 875-125 MG tablet  Every 12 hours     04/01/19 0851    chlorhexidine (PERIDEX) 0.12 % solution  2 times daily     04/01/19 0851           Elisha PonderMurray,  B, PA-C 04/01/19 [id number]    Loren RacerYelverton, David, MD 04/02/19 0800

## 2019-04-01 NOTE — ED Triage Notes (Signed)
Pt arrives to ED from home with complaints of upper right tooth pain starting on Thursday that has gotten worse.

## 2019-12-06 ENCOUNTER — Ambulatory Visit: Payer: Self-pay | Attending: Internal Medicine

## 2019-12-06 DIAGNOSIS — Z23 Encounter for immunization: Secondary | ICD-10-CM

## 2019-12-06 NOTE — Progress Notes (Signed)
   Covid-19 Vaccination Clinic  Name:  Kenneth Jimenez    MRN: [id number] DOB: 11-18-1965  12/06/2019  Mr. Tugwell was observed post Covid-19 immunization for 15 minutes without incident. He was provided with Vaccine Information Sheet and instruction to access the V-Safe system.   Mr. Enderson was instructed to call 911 with any severe reactions post vaccine: Marland Kitchen Difficulty breathing  . Swelling of face and throat  . A fast heartbeat  . A bad rash all over body  . Dizziness and weakness   Immunizations Administered    Name Date Dose VIS Date Route   Pfizer COVID-19 Vaccine 12/06/2019 12:14 PM 0.3 mL 08/23/2019 Intramuscular   Manufacturer: [employer], [employer]   Lot: [id number]   NDC: [address]-8

## 2019-12-31 ENCOUNTER — Ambulatory Visit: Payer: Medicaid Other | Attending: Internal Medicine

## 2019-12-31 DIAGNOSIS — Z23 Encounter for immunization: Secondary | ICD-10-CM

## 2019-12-31 NOTE — Progress Notes (Signed)
   Covid-19 Vaccination Clinic  Name:  Kenneth Jimenez    MRN: [id number] DOB: 12/17/1965  12/31/2019  Kenneth Jimenez was observed post Covid-19 immunization for 15 minutes without incident. He was provided with Vaccine Information Sheet and instruction to access the V-Safe system.   Kenneth Jimenez was instructed to call 911 with any severe reactions post vaccine: Marland Kitchen Difficulty breathing  . Swelling of face and throat  . A fast heartbeat  . A bad rash all over body  . Dizziness and weakness   Immunizations Administered    Name Date Dose VIS Date Route   Pfizer COVID-19 Vaccine 12/31/2019 11:58 AM 0.3 mL 11/06/2018 Intramuscular   Manufacturer: [employer], [employer]   Lot: [id number]   NDC: [address]-9

## 2020-03-24 ENCOUNTER — Ambulatory Visit (HOSPITAL_COMMUNITY): Payer: No Payment, Other | Admitting: Psychiatry

## 2020-03-24 ENCOUNTER — Other Ambulatory Visit: Payer: Self-pay

## 2020-03-24 DIAGNOSIS — F331 Major depressive disorder, recurrent, moderate: Secondary | ICD-10-CM | POA: Insufficient documentation

## 2020-03-24 DIAGNOSIS — F3130 Bipolar disorder, current episode depressed, mild or moderate severity, unspecified: Secondary | ICD-10-CM | POA: Insufficient documentation

## 2020-03-24 MED ORDER — OXCARBAZEPINE 600 MG PO TABS: 600.0000 mg | ORAL_TABLET | Freq: Two times a day (BID) | ORAL | 2 refills | Status: DC

## 2020-03-24 MED ORDER — PAROXETINE HCL 20 MG PO TABS: 20.0000 mg | ORAL_TABLET | Freq: Every day | ORAL | 2 refills | Status: DC

## 2020-03-24 MED ORDER — MIRTAZAPINE 15 MG PO TABS: 15.0000 mg | ORAL_TABLET | Freq: Every day | ORAL | 2 refills | Status: DC

## 2020-03-24 MED ORDER — HYDROXYZINE HCL 25 MG PO TABS: 25.0000 mg | ORAL_TABLET | Freq: Three times a day (TID) | ORAL | 2 refills | Status: DC | PRN

## 2020-03-24 MED ORDER — QUETIAPINE FUMARATE 300 MG PO TABS: 600.0000 mg | ORAL_TABLET | Freq: Every day | ORAL | 2 refills | Status: DC
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
## 2020-03-25 ENCOUNTER — Encounter (HOSPITAL_COMMUNITY): Payer: Self-pay | Admitting: Psychiatry

## 2020-04-03 ENCOUNTER — Telehealth: Payer: Self-pay

## 2020-04-03 NOTE — Telephone Encounter (Signed)
Rosey Bath calling from [place] for Kenneth Jimenez. Rosey Bath is trying to get the patient transferred to [facility]. [facility] no longer cares for his [facility] or his mental health meds that were formerly filled by [place]. Due to [facility] with [facility] instead of [facility].  Patient has no home, no funds, and is out of medication. Please advise CB- [phone]

## 2020-04-24 NOTE — Telephone Encounter (Signed)
Call placed to patient. LCSW introduced self and explained role at [facility]. Pt shared frustrations that he has been without his medications for over a month due to inability to pay. LCSW consulted with [facility], who agreed to contact [facility] [place] and have meds transferred to [facility]. Pt was informed that medications will be transferred and he can discuss costs with pharmacy at pick up. Pt appreciative for the assistance. LCSW e-mailed address and phone number of clinic to [email or web address], per pt request.

## 2020-04-27 MED FILL — QUETIAPINE FUMARATE 300 MG: 300 | 30 days supply | Qty: 60 | Fill #0

## 2020-05-04 MED FILL — hydrOXYzine HCL 25 MG TABS: 25 | 30 days supply | Qty: 90 | Fill #0

## 2020-05-04 MED FILL — PARoxetine HCL 20 MG TABS: 20 | 30 days supply | Qty: 30 | Fill #0

## 2020-05-04 MED FILL — OXcarbazepine 600 MG TABS: 600 | 30 days supply | Qty: 60 | Fill #0

## 2020-05-04 MED FILL — MIRTAZAPINE 15 MG TABLET: 15 | 30 days supply | Qty: 30 | Fill #0

## 2020-05-05 MED FILL — QUETIAPINE FUMARATE 300 MG: 300 | 30 days supply | Qty: 60 | Fill #0

## 2020-05-20 ENCOUNTER — Emergency Department (HOSPITAL_COMMUNITY)
Admission: EM | Admit: 2020-05-20 | Discharge: 2020-05-20 | Disposition: A | Discharge status: Home / Self Care | Payer: Medicaid Other | Attending: Emergency Medicine | Admitting: Emergency Medicine

## 2020-05-20 ENCOUNTER — Encounter (HOSPITAL_COMMUNITY): Payer: Self-pay

## 2020-05-20 DIAGNOSIS — F17210 Nicotine dependence, cigarettes, uncomplicated: Secondary | ICD-10-CM | POA: Insufficient documentation

## 2020-05-20 DIAGNOSIS — I2510 Atherosclerotic heart disease of native coronary artery without angina pectoris: Secondary | ICD-10-CM | POA: Insufficient documentation

## 2020-05-20 DIAGNOSIS — Z79899 Other long term (current) drug therapy: Secondary | ICD-10-CM | POA: Insufficient documentation

## 2020-05-20 DIAGNOSIS — B354 Tinea corporis: Secondary | ICD-10-CM | POA: Insufficient documentation

## 2020-05-20 LAB — CBC WITH DIFFERENTIAL/PLATELET
Abs Immature Granulocytes: 0.02 10*3/uL (ref 0.00–0.07)
Basophils Absolute: 0 10*3/uL (ref 0.0–0.1)
Basophils Relative: 1 %
Eosinophils Absolute: 0.2 10*3/uL (ref 0.0–0.5)
Eosinophils Relative: 3 %
HCT: 42.9 % (ref 39.0–52.0)
Hemoglobin: 14 g/dL (ref 13.0–17.0)
Immature Granulocytes: 0 %
Lymphocytes Relative: 34 %
Lymphs Abs: 2.5 10*3/uL (ref 0.7–4.0)
MCH: 31.3 pg (ref 26.0–34.0)
MCHC: 32.6 g/dL (ref 30.0–36.0)
MCV: 96 fL (ref 80.0–100.0)
Monocytes Absolute: 0.8 10*3/uL (ref 0.1–1.0)
Monocytes Relative: 11 %
Neutro Abs: 3.7 10*3/uL (ref 1.7–7.7)
Neutrophils Relative %: 51 %
Platelets: 194 10*3/uL (ref 150–400)
RBC: 4.47 MIL/uL (ref 4.22–5.81)
RDW: 14.3 % (ref 11.5–15.5)
WBC: 7.2 10*3/uL (ref 4.0–10.5)
nRBC: 0 % (ref 0.0–0.2)

## 2020-05-20 LAB — COMPREHENSIVE METABOLIC PANEL
ALT: 13 U/L (ref 0–44)
AST: 16 U/L (ref 15–41)
Albumin: 3.5 g/dL (ref 3.5–5.0)
Alkaline Phosphatase: 81 U/L (ref 38–126)
Anion gap: 9 (ref 5–15)
BUN: 17 mg/dL (ref 6–20)
CO2: 26 mmol/L (ref 22–32)
Calcium: 9.1 mg/dL (ref 8.9–10.3)
Chloride: 104 mmol/L (ref 98–111)
Creatinine, Ser: 0.99 mg/dL (ref 0.61–1.24)
GFR calc Af Amer: >60 mL/min (ref >60)
GFR calc non Af Amer: >60 mL/min (ref >60)
Glucose, Bld: 101 mg/dL — ABNORMAL HIGH (ref 70–99)
Potassium: 4.6 mmol/L (ref 3.5–5.1)
Sodium: 139 mmol/L (ref 135–145)
Total Bilirubin: 0.3 mg/dL (ref 0.3–1.2)
Total Protein: 7.1 g/dL (ref 6.5–8.1)

## 2020-05-20 LAB — HIV ANTIBODY (ROUTINE TESTING W REFLEX): HIV Screen 4th Generation wRfx: NONREACTIVE

## 2020-05-20 MED ORDER — CLOTRIMAZOLE 1 % EX CREA
TOPICAL_CREAM | Freq: Two times a day (BID) | CUTANEOUS | Status: DC
  Filled 2020-05-20: qty 15

## 2020-05-20 MED ORDER — FLUCONAZOLE 200 MG PO TABS: 200.0000 mg | ORAL_TABLET | Freq: Every day | ORAL | 0 refills | Status: AC

## 2020-05-20 NOTE — ED Triage Notes (Signed)
Pt complains of whelps all over his body for several days, he complains of severe itching Pt states he's not allergic to anything and there hasn't been a change in anything

## 2020-05-20 NOTE — Discharge Instructions (Addendum)
Eucerin Moisturizer Lotrimin twice a day Antifungal pill daily for one week Benadryl every 6 hours if needed for itching

## 2020-05-20 NOTE — ED Provider Notes (Signed)
[facility]-EMERGENCY DEPT Provider Note   CSN: [id number] Arrival date & time: 05/20/20  0335     History No chief complaint on file.   Kenneth Jimenez is a 54 y.o. male.  Patient presents to the emergency department for evaluation of a rash.  Patient reports that he has multiple round spots on his body that have been present for several days or more.  He reports that the areas are very itchy.  He thinks it might be an allergic reaction but he has never had any similar type reaction.  He has not eaten any new foods, taken any new medications or used any new skin products.  No tongue swelling, throat swelling or difficulty breathing.        Past Medical History:  Diagnosis Date  . Anorexia   . Atrial fib/flutter, transient   . Back pain   . Bipolar 1 disorder (HCC)   . Bipolar disorder (HCC)   . Bradycardia   . Chest pain   . Coronary artery disease   . Hyperthyroidism   . Palpitations   . Seizures (HCC)   . Tachyarrhythmia     Patient Active Problem List   Diagnosis Date Noted  . Bipolar I disorder, most recent episode depressed (HCC) 03/24/2020  . Moderate episode of recurrent major depressive disorder (HCC) 03/24/2020  . Cough 02/06/2013  . Itching 03/21/2011  . Seasonal allergies 12/31/2010  . HYPERTHYROIDISM 09/30/2010  . BIPOLAR DISORDER UNSPECIFIED 09/30/2010  . MYOCARDIAL INFARCTION, HX OF 11/02/2009  . TOBACCO USER 05/30/2009    History reviewed. No pertinent surgical history.     Family History  Problem Relation Age of Onset  . Heart disease Mother   . Diabetes Mother   . Heart failure Mother   . Heart disease Father   . Heart attack Father     Social History   Tobacco Use  . Smoking status: Current Every Day Smoker    Packs/day: 0.25    Types: Cigarettes  . Smokeless tobacco: Current User  Substance Use Topics  . Alcohol use: No  . Drug use: Yes    Types: Marijuana    Home Medications Prior to Admission  medications   Medication Sig Start Date End Date Taking? Authorizing Provider  chlorhexidine (PERIDEX) 0.12 % solution Use as directed 15 mLs in the mouth or throat 2 (two) times daily. Swish and spit.  Do not swallow. 04/01/19   Aviva Kluver B, PA-C  fluconazole (DIFLUCAN) 200 MG tablet Take 1 tablet (200 mg total) by mouth daily. 05/20/20   Gilda Crease, MD  hydrOXYzine (ATARAX/VISTARIL) 25 MG tablet Take 1 tablet (25 mg total) by mouth 3 (three) times daily as needed. 03/24/20   Shanna Cisco, NP  mirtazapine (REMERON) 15 MG tablet Take 1 tablet (15 mg total) by mouth at bedtime. 03/24/20   Shanna Cisco, NP  oxcarbazepine (TRILEPTAL) 600 MG tablet Take 1 tablet (600 mg total) by mouth 2 (two) times daily. 03/24/20   Shanna Cisco, NP  PARoxetine (PAXIL) 20 MG tablet Take 1 tablet (20 mg total) by mouth daily. 03/24/20   Shanna Cisco, NP  QUEtiapine (SEROQUEL) 300 MG tablet Take 2 tablets (600 mg total) by mouth at bedtime. 03/24/20   Shanna Cisco, NP    Allergies    Penicillins  Review of Systems   Review of Systems  Skin: Positive for rash.  All other systems reviewed and are negative.   Physical Exam Updated  Vital Signs BP (!) 149/85 (BP Location: Left Arm)   Pulse 75   Temp 98.1 F (36.7 C) (Oral)   Resp 15   Ht 5\' 8"  (1.727 m)   Wt 73 kg   SpO2 96%   BMI 24.48 kg/m   Physical Exam Vitals and nursing note reviewed.  Constitutional:      General: He is not in acute distress.    Appearance: Normal appearance. He is well-developed.  HENT:     Head: Normocephalic and atraumatic.     Right Ear: Hearing normal.     Left Ear: Hearing normal.     Nose: Nose normal.  Eyes:     Conjunctiva/sclera: Conjunctivae normal.     Pupils: Pupils are equal, round, and reactive to light.  Cardiovascular:     Rate and Rhythm: Regular rhythm.     Heart sounds: S1 normal and S2 normal. No murmur heard.  No friction rub. No gallop.   Pulmonary:      Effort: Pulmonary effort is normal. No respiratory distress.     Breath sounds: Normal breath sounds.  Chest:     Chest wall: No tenderness.  Abdominal:     General: Bowel sounds are normal.     Palpations: Abdomen is soft.     Tenderness: There is no abdominal tenderness. There is no guarding or rebound. Negative signs include Murphy's sign and McBurney's sign.     Hernia: No hernia is present.  Musculoskeletal:        General: Normal range of motion.     Cervical back: Normal range of motion and neck supple.  Skin:    General: Skin is warm and dry.     Findings: Rash (Multiple circular, slightly raised hyperpigmented lesions) present.  Neurological:     Mental Status: He is alert and oriented to person, place, and time.     GCS: GCS eye subscore is 4. GCS verbal subscore is 5. GCS motor subscore is 6.     Cranial Nerves: No cranial nerve deficit.     Sensory: No sensory deficit.     Coordination: Coordination normal.  Psychiatric:        Speech: Speech normal.        Behavior: Behavior normal.        Thought Content: Thought content normal.     ED Results / Procedures / Treatments   Labs (all labs ordered are listed, but only abnormal results are displayed) Labs Reviewed  HIV ANTIBODY (ROUTINE TESTING W REFLEX)  COMPREHENSIVE METABOLIC PANEL  CBC WITH DIFFERENTIAL/PLATELET    EKG None  Radiology No results found.  Procedures Procedures (including critical care time)  Medications Ordered in ED Medications - No data to display  ED Course  I have reviewed the triage vital signs and the nursing notes.  Pertinent labs & imaging results that were available during my care of the patient were reviewed by me and considered in my medical decision making (see chart for details).    MDM Rules/Calculators/A&P                          Patient with multiple lesions that are consistent with ringworm.  Due to the number of lesions, will perform HIV testing.  Patient denies  any concern for HIV infection.  Recommend oral fluconazole and topical antifungal.  Final Clinical Impression(s) / ED Diagnoses Final diagnoses:  Ringworm of body    Rx / DC Orders ED  Discharge Orders         Ordered    fluconazole (DIFLUCAN) 200 MG tablet  Daily        05/20/20 0523           Gilda Crease, MD 05/20/20 [phone]

## 2020-05-25 ENCOUNTER — Telehealth (INDEPENDENT_AMBULATORY_CARE_PROVIDER_SITE_OTHER): Payer: No Payment, Other | Admitting: Psychiatry

## 2020-05-25 ENCOUNTER — Encounter (HOSPITAL_COMMUNITY): Payer: Self-pay | Admitting: Psychiatry

## 2020-05-25 ENCOUNTER — Other Ambulatory Visit: Payer: Self-pay

## 2020-05-25 DIAGNOSIS — F331 Major depressive disorder, recurrent, moderate: Secondary | ICD-10-CM

## 2020-05-25 DIAGNOSIS — F3130 Bipolar disorder, current episode depressed, mild or moderate severity, unspecified: Secondary | ICD-10-CM | POA: Diagnosis not present

## 2020-05-25 MED ORDER — HYDROXYZINE HCL 25 MG PO TABS: 25.0000 mg | ORAL_TABLET | Freq: Three times a day (TID) | ORAL | 2 refills | Status: DC | PRN

## 2020-05-25 MED ORDER — PAROXETINE HCL 20 MG PO TABS: 20.0000 mg | ORAL_TABLET | Freq: Every day | ORAL | 2 refills | Status: DC

## 2020-05-25 MED ORDER — OXCARBAZEPINE 600 MG PO TABS: 600.0000 mg | ORAL_TABLET | Freq: Two times a day (BID) | ORAL | 2 refills | Status: DC

## 2020-05-25 MED ORDER — QUETIAPINE FUMARATE 300 MG PO TABS: 600.0000 mg | ORAL_TABLET | Freq: Every day | ORAL | 2 refills | Status: DC

## 2020-05-25 MED ORDER — MIRTAZAPINE 15 MG PO TABS: 15.0000 mg | ORAL_TABLET | Freq: Every day | ORAL | 2 refills | Status: DC

## 2020-05-25 NOTE — Progress Notes (Signed)
BH MD/PA/NP OP Progress Note Virtual Visit via Video Note  I connected with Kenneth Jimenez on 05/25/20 at  3:00 PM EDT by a video enabled telemedicine application and verified that I am speaking with the correct person using two identifiers.  Location: Patient: Home Provider: Clinic   I discussed the limitations of evaluation and management by telemedicine and the availability of in person appointments. The patient expressed understanding and agreed to proceed.  I provided 30 minutes of non-face-to-face time during this encounter.    05/25/2020 3:56 PM Kenneth Jimenez  MRN:  [id number]  Chief Complaint: "I ran out of medications. I really haven't taken them since March".  HPI: 54 year old male seen today for follow up psychiatric evaluation.   He has a psychiatric history of bipolar disorder, PTSD, explosive disorder, depression, and cannabis use disorder.  Patient is currently prescribed Trileptal 600 mg twice daily, mirtazapine 15 mg at bedtime, hydroxyzine 25 mg 3 times daily as needed, Paxil 20 mg daily, and Seroquel 600 HS.  Today patient is well groomed, pleasant, cooperative, engaged in conversation, and maintained eye contact. He notes that his mood is irritable and depressed. He endorces symptoms of depression such as insomnia, psychomotor agitation, fatigue, feelings of guilt, difficulty concentrating, and loss of energy.  He reports that he ran out of his medications and were not able to have then refilled after last visit in July. He notes that he recently was approved for housing and is grateful that he is no longer homeless. He notes that he is also in a relationship and notes that he wants to improve his mental health condition so that his relationship will be successful. He notes that since he has been off his medications he has been distractible, irritable, have racing thoughts, poor sleep, and liable mood. He denies SI/HI/VAH or paranoia.      No medication adjustments made  today. He is agreeable to restarting medication and taking them as prescribed.   No other concerns at this time. Visit Diagnosis:    ICD-10-CM   1. Moderate episode of recurrent major depressive disorder (HCC)  F33.1 hydrOXYzine (ATARAX/VISTARIL) 25 MG tablet    mirtazapine (REMERON) 15 MG tablet    PARoxetine (PAXIL) 20 MG tablet  2. Bipolar I disorder, most recent episode depressed (HCC)  F31.30 oxcarbazepine (TRILEPTAL) 600 MG tablet    QUEtiapine (SEROQUEL) 300 MG tablet    Past Psychiatric History:  bipolar disorder, PTSD, explosive disorder, depression, and cannabis use disorder  Past Medical History:  Past Medical History:  Diagnosis Date  . Anorexia   . Atrial fib/flutter, transient   . Back pain   . Bipolar 1 disorder (HCC)   . Bipolar disorder (HCC)   . Bradycardia   . Chest pain   . Coronary artery disease   . Hyperthyroidism   . Palpitations   . Seizures (HCC)   . Tachyarrhythmia    No past surgical history on file.  Family Psychiatric History:  PTSD, Depression, Bipolar disorder,   Family History:  Family History  Problem Relation Age of Onset  . Heart disease Mother   . Diabetes Mother   . Heart failure Mother   . Heart disease Father   . Heart attack Father     Social History:  Social History   Socioeconomic History  . Marital status: Married    Spouse name: Not on file  . Number of children: Not on file  . Years of education: Not on file  . Highest education  level: Not on file  Occupational History  . Not on file  Tobacco Use  . Smoking status: Current Every Day Smoker    Packs/day: 0.25    Types: Cigarettes  . Smokeless tobacco: Current User  Substance and Sexual Activity  . Alcohol use: No  . Drug use: Yes    Types: Marijuana  . Sexual activity: Yes  Other Topics Concern  . Not on file  Social History Narrative   Currently working to get medicaid.    Social Determinants of Health   Financial Resource Strain:   . Difficulty of  Paying Living Expenses: Not on file  Food Insecurity:   . Worried About Programme researcher, broadcasting/film/video in the Last Year: Not on file  . Ran Out of Food in the Last Year: Not on file  Transportation Needs:   . Lack of Transportation (Medical): Not on file  . Lack of Transportation (Non-Medical): Not on file  Physical Activity:   . Days of Exercise per Week: Not on file  . Minutes of Exercise per Session: Not on file  Stress:   . Feeling of Stress : Not on file  Social Connections:   . Frequency of Communication with Friends and Family: Not on file  . Frequency of Social Gatherings with Friends and Family: Not on file  . Attends Religious Services: Not on file  . Active Member of Clubs or Organizations: Not on file  . Attends Banker Meetings: Not on file  . Marital Status: Not on file    Allergies:  Allergies  Allergen Reactions  . Penicillins Hives    Metabolic Disorder Labs: No results found for: HGBA1C, MPG No results found for: PROLACTIN No results found for: CHOL, TRIG, HDL, CHOLHDL, VLDL, LDLCALC Lab Results  Component Value Date   TSH 0.381 02/06/2013   TSH 0.376 03/21/2011    Therapeutic Level Labs: No results found for: LITHIUM Lab Results  Component Value Date   VALPROATE <10.0 (L) 12/22/2014   VALPROATE <10.0 (L) 09/11/2010   No components found for:  CBMZ  Current Medications: Current Outpatient Medications  Medication Sig Dispense Refill  . chlorhexidine (PERIDEX) 0.12 % solution Use as directed 15 mLs in the mouth or throat 2 (two) times daily. Swish and spit.  Do not swallow. 120 mL 0  . fluconazole (DIFLUCAN) 200 MG tablet Take 1 tablet (200 mg total) by mouth daily. 7 tablet 0  . hydrOXYzine (ATARAX/VISTARIL) 25 MG tablet Take 1 tablet (25 mg total) by mouth 3 (three) times daily as needed. 90 tablet 2  . mirtazapine (REMERON) 15 MG tablet Take 1 tablet (15 mg total) by mouth at bedtime. 30 tablet 2  . oxcarbazepine (TRILEPTAL) 600 MG tablet Take  1 tablet (600 mg total) by mouth 2 (two) times daily. 60 tablet 2  . PARoxetine (PAXIL) 20 MG tablet Take 1 tablet (20 mg total) by mouth daily. 30 tablet 2  . QUEtiapine (SEROQUEL) 300 MG tablet Take 2 tablets (600 mg total) by mouth at bedtime. 60 tablet 2   No current facility-administered medications for this visit.     Musculoskeletal: Strength & Muscle Tone: Unable to assess due to telehealth visit Gait & Station: Unable to assess due to telehealth visit Patient leans: N/A  Psychiatric Specialty Exam: Review of Systems  There were no vitals taken for this visit.There is no height or weight on file to calculate BMI.  General Appearance: Well Groomed  Eye Contact:  Good  Speech:  Clear and Coherent and Normal Rate  Volume:  Normal  Mood:  Depressed and Irritable  Affect:  Appropriate and Congruent  Thought Process:  Coherent, Goal Directed and Linear  Orientation:  Full (Time, Place, and Person)  Thought Content: WDL and Logical   Suicidal Thoughts:  No  Homicidal Thoughts:  No  Memory:  Immediate;   Good Recent;   Good Remote;   Good  Judgement:  Good  Insight:  Good  Psychomotor Activity:  Normal  Concentration:  Concentration: Good and Attention Span: Good  Recall:  Good  Fund of Knowledge: Good  Language: Good  Akathisia:  No  Handed:  Left  AIMS (if indicated): Not done  Assets:  Communication Skills Desire for Improvement Financial Resources/Insurance Housing Intimacy Leisure Time Social Support  ADL's:  Intact  Cognition: WNL  Sleep:  Good   Screenings:   Assessment and Plan: Patient endorses worsening irritability and depression. He notes that he ran out of his medications. No medication adjustments made today. He is agreeable to restarting medication and taking them as prescribed.   1. Moderate episode of recurrent major depressive disorder (HCC)  Continue- hydrOXYzine (ATARAX/VISTARIL) 25 MG tablet; Take 1 tablet (25 mg total) by mouth 3 (three)  times daily as needed.  Dispense: 90 tablet; Refill: 2 Continue- mirtazapine (REMERON) 15 MG tablet; Take 1 tablet (15 mg total) by mouth at bedtime.  Dispense: 30 tablet; Refill: 2 Continue- PARoxetine (PAXIL) 20 MG tablet; Take 1 tablet (20 mg total) by mouth daily.  Dispense: 30 tablet; Refill: 2  2. Bipolar I disorder, most recent episode depressed (HCC)  Continue- oxcarbazepine (TRILEPTAL) 600 MG tablet; Take 1 tablet (600 mg total) by mouth 2 (two) times daily.  Dispense: 60 tablet; Refill: 2 Continue- QUEtiapine (SEROQUEL) 300 MG tablet; Take 2 tablets (600 mg total) by mouth at bedtime.  Dispense: 60 tablet; Refill: 2  Follow up in 3 months Follow up with outpatient therapist.   Shanna Cisco, NP 05/25/2020, 3:56 PM

## 2020-05-26 ENCOUNTER — Ambulatory Visit (INDEPENDENT_AMBULATORY_CARE_PROVIDER_SITE_OTHER): Payer: No Payment, Other | Admitting: Clinical

## 2020-05-26 DIAGNOSIS — F3130 Bipolar disorder, current episode depressed, mild or moderate severity, unspecified: Secondary | ICD-10-CM | POA: Diagnosis not present

## 2020-05-26 DIAGNOSIS — F1210 Cannabis abuse, uncomplicated: Secondary | ICD-10-CM
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
## 2020-06-03 ENCOUNTER — Other Ambulatory Visit (HOSPITAL_COMMUNITY): Payer: Self-pay | Admitting: *Deleted

## 2020-06-03 ENCOUNTER — Telehealth (HOSPITAL_COMMUNITY): Payer: Self-pay | Admitting: *Deleted

## 2020-06-03 DIAGNOSIS — F331 Major depressive disorder, recurrent, moderate: Secondary | ICD-10-CM

## 2020-06-03 DIAGNOSIS — F3130 Bipolar disorder, current episode depressed, mild or moderate severity, unspecified: Secondary | ICD-10-CM

## 2020-06-03 MED ORDER — OXCARBAZEPINE 600 MG PO TABS: 600.0000 mg | ORAL_TABLET | Freq: Two times a day (BID) | ORAL | 2 refills | Status: DC

## 2020-06-03 MED ORDER — MIRTAZAPINE 15 MG PO TABS: 15.0000 mg | ORAL_TABLET | Freq: Every day | ORAL | 2 refills | Status: DC

## 2020-06-03 MED ORDER — HYDROXYZINE HCL 25 MG PO TABS: 25.0000 mg | ORAL_TABLET | Freq: Three times a day (TID) | ORAL | 2 refills | Status: DC | PRN

## 2020-06-03 MED ORDER — PAROXETINE HCL 20 MG PO TABS: 20.0000 mg | ORAL_TABLET | Freq: Every day | ORAL | 2 refills | Status: DC

## 2020-06-03 MED ORDER — QUETIAPINE FUMARATE 300 MG PO TABS: 600.0000 mg | ORAL_TABLET | Freq: Every day | ORAL | 2 refills | Status: DC

## 2020-06-03 MED FILL — MIRTAZAPINE 15 MG TABLET: 15 | 30 days supply | Qty: 30 | Fill #0

## 2020-06-03 MED FILL — hydrOXYzine HCL 25 MG TABS: 25 | 30 days supply | Qty: 90 | Fill #0

## 2020-06-03 MED FILL — QUETIAPINE FUMARATE 300 MG: 300 | 30 days supply | Qty: 60 | Fill #0

## 2020-06-03 MED FILL — PARoxetine HCL 20 MG TABS: 20 | 30 days supply | Qty: 30 | Fill #0

## 2020-06-03 MED FILL — OXcarbazepine 600 MG TABS: 600 | 30 days supply | Qty: 60 | Fill #0

## 2020-06-03 NOTE — Telephone Encounter (Signed)
Medication management - Telephone message left for patient that this Clinical Nurse Manager would try to call him back again later today to dsicuss options for his medications related to his Medicaid co-payment.  Informed patient on message that we could not waive this co-payment fee but [facility] would allow him to fill his medications, knowing it is still his responsibility to pay the bill that can go up to $100 but not more if immediate need to fill the medication without means currently.

## 2020-06-03 NOTE — Telephone Encounter (Signed)
Everlene Balls, supervisor Artist that he had been in conversation with [facility] and [facility] and patient will get a one time fill for free on his five medicines but next time and from then on he will have to pay for them at what ever percentage he is responsible for. In the next month he is to make an appt with [facility] to apply for the [employer] and find out what his percentage is based on his financial situation which he will have to show proof of to [facility]. Shawn asked that I move all of his psychiatric medicines to [facility] now since they are the ones involved and [facility] will no longer serve him as he has been threatening to staff, this per the Pharmacist Raynelle Fanning

## 2020-06-03 NOTE — Telephone Encounter (Signed)
Returned call to patient to advise that we could not get his medication for free.  Patient has medicaid and I provided potiential cost.  Patient began cussing and stating he was going to call the news stations and asked to speak to Director. Message sent to Gretchen Short to call patient. Plan to inform Everlene Balls.

## 2020-06-03 NOTE — Telephone Encounter (Signed)
Spoke with patient after getting a call from [place] about his meds.  Patient was irate because he could not get his meds for free.  He cussed and carried on in a rage.  I called the pharmacy and they stated his meds would cost 64.63.  Pharmacy also stated the would not fill his scripts because he had cussed the pharmacy staff out on several occasions. Patient has medicaid.  Patient requested a call from you.

## 2020-06-04 NOTE — Telephone Encounter (Signed)
Discussed with Kenneth Jimenez, Director of Social Work as he is following up with Kenneth Jimenez.  Discussed options for medications with [facility] per Kenneth Jimenez there and Kenneth Jimenez's need to apply for services there and for a blue card that may also help reduce future medication costs if Kenneth Jimenez qualifies.  Kenneth Jimenez to discuss all options with Kenneth Jimenez and that they could do a one time "free fill" of his current medications which cost $40 total but that would be a one time deal and not able to do again for 1 year.  Kenneth Jimenez to call back if anything else needed and orders to be sent to [facility] as needed.

## 2020-06-16 ENCOUNTER — Telehealth (HOSPITAL_COMMUNITY): Payer: Self-pay | Admitting: *Deleted

## 2020-06-16 ENCOUNTER — Ambulatory Visit (HOSPITAL_COMMUNITY)
Admission: EM | Admit: 2020-06-16 | Discharge: 2020-06-16 | Disposition: A | Discharge status: Home / Self Care | Payer: No Payment, Other

## 2020-06-16 ENCOUNTER — Other Ambulatory Visit: Payer: Self-pay

## 2020-06-16 NOTE — Telephone Encounter (Signed)
Provider called patient who notes that he has increased depression, SI/HI in the context of relationship issues. Patient notes that he would like to come in and be assessed. He was with a friend during the time of the call who informed provider that she would drive him to [facility] for evaluation and  medication management. Provider informed [facility] staff that patient was on his way for evaluation. They endorsed understanding and agreed.

## 2020-06-16 NOTE — Telephone Encounter (Signed)
Patient called stated he call last week  & waited for return call. And he  Really needs to speak with you.  Just listening to him he sounds VERY ANXIOUS. Please return call

## 2020-06-17 ENCOUNTER — Other Ambulatory Visit: Payer: Self-pay

## 2020-06-17 ENCOUNTER — Encounter (HOSPITAL_COMMUNITY): Payer: Self-pay | Admitting: Emergency Medicine

## 2020-06-17 ENCOUNTER — Emergency Department (HOSPITAL_COMMUNITY)
Admission: EM | Admit: 2020-06-17 | Discharge: 2020-06-18 | Disposition: A | Discharge status: Home / Self Care | Payer: Medicaid Other | Attending: Emergency Medicine | Admitting: Emergency Medicine

## 2020-06-17 DIAGNOSIS — Z20822 Contact with and (suspected) exposure to covid-19: Secondary | ICD-10-CM | POA: Insufficient documentation

## 2020-06-17 DIAGNOSIS — F17210 Nicotine dependence, cigarettes, uncomplicated: Secondary | ICD-10-CM | POA: Insufficient documentation

## 2020-06-17 DIAGNOSIS — I2510 Atherosclerotic heart disease of native coronary artery without angina pectoris: Secondary | ICD-10-CM | POA: Insufficient documentation

## 2020-06-17 DIAGNOSIS — Z046 Encounter for general psychiatric examination, requested by authority: Secondary | ICD-10-CM | POA: Insufficient documentation

## 2020-06-17 DIAGNOSIS — F1210 Cannabis abuse, uncomplicated: Secondary | ICD-10-CM | POA: Insufficient documentation

## 2020-06-17 DIAGNOSIS — F3130 Bipolar disorder, current episode depressed, mild or moderate severity, unspecified: Secondary | ICD-10-CM | POA: Insufficient documentation

## 2020-06-17 DIAGNOSIS — Z008 Encounter for other general examination: Secondary | ICD-10-CM

## 2020-06-17 LAB — COMPREHENSIVE METABOLIC PANEL
ALT: 13 U/L (ref 0–44)
AST: 14 U/L — ABNORMAL LOW (ref 15–41)
Albumin: 3.6 g/dL (ref 3.5–5.0)
Alkaline Phosphatase: 84 U/L (ref 38–126)
Anion gap: 12 (ref 5–15)
BUN: 7 mg/dL (ref 6–20)
CO2: 22 mmol/L (ref 22–32)
Calcium: 9.6 mg/dL (ref 8.9–10.3)
Chloride: 103 mmol/L (ref 98–111)
Creatinine, Ser: 1.09 mg/dL (ref 0.61–1.24)
GFR calc non Af Amer: >60 mL/min (ref >60)
Glucose, Bld: 146 mg/dL — ABNORMAL HIGH (ref 70–99)
Potassium: 4 mmol/L (ref 3.5–5.1)
Sodium: 137 mmol/L (ref 135–145)
Total Bilirubin: 0.6 mg/dL (ref 0.3–1.2)
Total Protein: 7.3 g/dL (ref 6.5–8.1)

## 2020-06-17 LAB — RESPIRATORY PANEL BY RT PCR (FLU A&B, COVID)
Influenza A by PCR: NEGATIVE
Influenza B by PCR: NEGATIVE
SARS Coronavirus 2 by RT PCR: NEGATIVE

## 2020-06-17 LAB — CBC
HCT: 46.8 % (ref 39.0–52.0)
Hemoglobin: 15.8 g/dL (ref 13.0–17.0)
MCH: 31.4 pg (ref 26.0–34.0)
MCHC: 33.8 g/dL (ref 30.0–36.0)
MCV: 93 fL (ref 80.0–100.0)
Platelets: 259 10*3/uL (ref 150–400)
RBC: 5.03 MIL/uL (ref 4.22–5.81)
RDW: 14.2 % (ref 11.5–15.5)
WBC: 5.7 10*3/uL (ref 4.0–10.5)
nRBC: 0 % (ref 0.0–0.2)

## 2020-06-17 LAB — ETHANOL: Alcohol, Ethyl (B): <10 mg/dL (ref <10)

## 2020-06-17 LAB — SALICYLATE LEVEL: Salicylate Lvl: <7 mg/dL — ABNORMAL LOW (ref 7.0–30.0)

## 2020-06-17 LAB — ACETAMINOPHEN LEVEL: Acetaminophen (Tylenol), Serum: <10 ug/mL — ABNORMAL LOW (ref 10–30)

## 2020-06-17 MED ORDER — PAROXETINE HCL 20 MG PO TABS
20.0000 mg | ORAL_TABLET | Freq: Every day | ORAL | Status: DC
  Administered 2020-06-18: 20 mg via ORAL
  Filled 2020-06-17: qty 1

## 2020-06-17 MED ORDER — LORAZEPAM 2 MG/ML IJ SOLN: 2.0000 mg | Freq: Once | INTRAMUSCULAR | Status: DC

## 2020-06-17 MED ORDER — LORAZEPAM 1 MG PO TABS: 1.0000 mg | ORAL_TABLET | Freq: Once | ORAL | Status: DC

## 2020-06-17 MED ORDER — STERILE WATER FOR INJECTION IJ SOLN
INTRAMUSCULAR | Status: AC
  Filled 2020-06-17: qty 10

## 2020-06-17 MED ORDER — OXCARBAZEPINE 300 MG PO TABS
600.0000 mg | ORAL_TABLET | Freq: Two times a day (BID) | ORAL | Status: DC
  Administered 2020-06-17: 600 mg via ORAL
  Filled 2020-06-17 (×2): qty 2

## 2020-06-17 MED ORDER — MIRTAZAPINE 15 MG PO TABS
15.0000 mg | ORAL_TABLET | Freq: Every day | ORAL | Status: DC
  Administered 2020-06-17: 15 mg via ORAL
  Filled 2020-06-17: qty 1

## 2020-06-17 MED ORDER — ZIPRASIDONE MESYLATE 20 MG IM SOLR
20.0000 mg | Freq: Once | INTRAMUSCULAR | Status: DC
  Filled 2020-06-17: qty 20

## 2020-06-17 MED ORDER — LORAZEPAM 2 MG/ML IJ SOLN
1.0000 mg | Freq: Once | INTRAMUSCULAR | Status: AC
  Administered 2020-06-17: 1 mg via INTRAMUSCULAR
  Filled 2020-06-17: qty 1

## 2020-06-17 MED ORDER — HYDROXYZINE HCL 25 MG PO TABS
25.0000 mg | ORAL_TABLET | Freq: Three times a day (TID) | ORAL | Status: DC | PRN
  Administered 2020-06-17: 25 mg via ORAL
  Filled 2020-06-17: qty 1

## 2020-06-17 MED ORDER — QUETIAPINE FUMARATE 200 MG PO TABS
600.0000 mg | ORAL_TABLET | Freq: Every day | ORAL | Status: DC
  Administered 2020-06-17: 300 mg via ORAL
  Filled 2020-06-17: qty 2
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
## 2020-06-17 NOTE — ED Notes (Signed)
The pt refuses all medication until hes in a room he remains disruptive and rude cursing almost continuously

## 2020-06-17 NOTE — ED Triage Notes (Signed)
Pt arrives to ED under IVC with [employer]. Per IVC paperwork patient called provider that he was having SI/HI due to depression from his relationship. Patient went to [facility] and left without being seen and was IVC by the provider there. Pt calm and cooperative in triage.

## 2020-06-17 NOTE — ED Notes (Signed)
Security standingby.

## 2020-06-17 NOTE — Telephone Encounter (Signed)
  LWBS before Triage:  Noted  @ [facility]

## 2020-06-17 NOTE — Telephone Encounter (Signed)
Provider initiated IVC protocol.

## 2020-06-17 NOTE — ED Notes (Signed)
The pt has been ttsd  The pt is not staying in his chair in the hallway he continues to talk loudly walking back and forth with security in the  hallway

## 2020-06-17 NOTE — ED Notes (Signed)
Pt refuses to sit in the recliner in the hallway conrinuously walks  All over the place  Security with him when hes approached he gets loud and mouthy

## 2020-06-17 NOTE — ED Notes (Signed)
When rushing to Trauma B pt stopped NT in hall and asked for help. NT stated that she could not stop to help at the moment. Pt responded "f**k you b**ch you ain't doing nothing"

## 2020-06-17 NOTE — BH Assessment (Signed)
Comprehensive Clinical Assessment (CCA) Screening, Triage and Referral Note  06/17/2020 Kenneth Jimenez [id number]    Kenneth Jimenez  Is a 54 year old male who presents to [facility] via [employer] under IVC. Pt reports that he was in a relationship for a few months and his significant other decided to split up with him and he became upset and lost control. Pt admits to passive homicidal thoughts towards significant other yesterday when incident occurred but states today is calm and not SI, HI, AVH or SIB. Pt reports no prior SI attempts. Per pt chart Patient went came to [facility] and left without being seen and was IVC by the provider there."  Pt reports he has a follow up appointment with therapist on October 18th and again with Doctor on the 23rd. Pt reports he is currently taking medications Seroquel, Paxil and a few others. Per pt chart Client reported a history of difficulty with mood swings and "melt downs" that also affected his past dealings with the legal system. Client reported the symptoms have affected his interpersonal relationships.  Pt currently under IVC by MD at this time.     Per IVC:  Provider called patient on 06/16/21 - was informed that he was having SI/HI. He noted that he was increasingly depressed due to relationship issues, stated he wanted to harm himself and his significant other. He came into [facility] however left without being seen. Patient is a danger to himself and others.  Disposition: Nira Conn, FNP recommends pt for overnight observation, reassess in the morning. Diagnosis: Bipolar Disorder, depressed type          Cannabis use disorder, mild   Visit Diagnosis: IVC  Patient Reported Information How did you hear about [place]?[facility]  Referral name: Dr Doyne Keel [facility]  Referral phone number: No data recorded Whom do you see for routine medical problems? I don't have a doctor   Practice/Facility Name: No data recorded  Practice/Facility Phone Number: No data  recorded  Name of Contact: No data recorded  Contact Number: No data recorded  Contact Fax Number: No data recorded  Prescriber Name: No data recorded  Prescriber Address (if known): No data recorded What Is the Reason for Your Visit/Call Today? IVC How Long Has This Been Causing You Problems? <Week  Have You Recently Been in Any Inpatient Treatment (Hospital/Detox/Crisis Center/28-Day Program)? No   Name/Location of Program/Hospital:No data recorded  How Long Were You There? No data recorded  When Were You Discharged? No data recorded Have You Ever Received Services From [facility] Before? No   Who Do You See at [facility]? No data recorded Have You Recently Had Any Thoughts About Hurting Yourself? No   Are You Planning to Commit Suicide/Harm Yourself At This time?  No  Have you Recently Had Thoughts About Hurting Someone Kenneth Jimenez? No   Explanation: No data recorded Have You Used Any Alcohol or Drugs in the Past 24 Hours? No   How Long Ago Did You Use Drugs or Alcohol?  No data recorded  What Did You Use and How Much? No data recorded What Do You Feel Would Help You the Most Today? Therapy;Medication  Do You Currently Have a Therapist/Psychiatrist? Yes   Name of Therapist[facility]Psychiatrist[facility]   Have You Been Recently Discharged From Any Office Practice or Programs? No   Explanation of Discharge From Practice/Program:  No data recorded    CCA Screening Triage Referral Assessment Type of Contact: Tele-Assessment   Is this Initial or  Reassessment? Initial Assessment   Date Telepsych consult ordered in [facility]:  06/17/20   Time Telepsych consult ordered in [facility]:  No data recorded Patient Reported Information Reviewed? Yes   Patient Left Without Being Seen? No data recorded  Reason for Not Completing Assessment: No data recorded Collateral Involvement: No data recorded Does Patient Have a Court Appointed Legal Guardian? No data recorded  Name and Contact of Legal  Guardian:  No data recorded If Minor and Not Living with Parent(s), Who has Custody? No data recorded Is CPS involved or ever been involved? Never  Is APS involved or ever been involved? Never  Patient Determined To Be At Risk for Harm To Self or Others Based on Review of Patient Reported Information or Presenting Complaint? No   Method: No data recorded  Availability of Means: No data recorded  Intent: No data recorded  Notification Required: No data recorded  Additional Information for Danger to Others Potential:  No data recorded  Additional Comments for Danger to Others Potential:  No data recorded  Are There Guns or Other Weapons in Your Home?  No data recorded   Types of Guns/Weapons: No data recorded   Are These Weapons Safely Secured?                              No data recorded   Who Could Verify You Are Able To Have These Secured:    No data recorded Do You Have any Outstanding Charges, Pending Court Dates, Parole/Probation? No data recorded Contacted To Inform of Risk of Harm To Self or Others: No data recorded Location of Assessment: [facility] ED  Does Patient Present under Involuntary Commitment? Yes   IVC Papers Initial File Date: 06/17/20   [place] of Residence: [place]  Patient Currently Receiving the Following Services: Medication Management;Individual Therapy   Determination of Need: Emergent (2 hours)   Options For Referral: Overnight Observation  Kenneth Jimenez, LCSWA

## 2020-06-17 NOTE — ED Notes (Signed)
Pt more cooperative at this time. Placed in a room rather than hallway to decrease stimulation. Pt was provided a phone to call wife; instructed not to call [facility] again, pt expressed understanding. Sitter at bedside. Pt took night time meds (only took half dose of Seroquel due to getting ativan injection earlier).

## 2020-06-17 NOTE — ED Notes (Signed)
Still needs a urine

## 2020-06-17 NOTE — ED Notes (Signed)
The ptis continuing to walk the halls will not sit down security is at his side

## 2020-06-17 NOTE — ED Provider Notes (Signed)
[facility] EMERGENCY DEPARTMENT Provider Note   CSN: [id number] Arrival date & time: 06/17/20  1401     History Chief Complaint  Patient presents with   IVC   Psychiatric Evaluation    Kenneth Jimenez is a 54 y.o. male.  HPI   54 year old male with a history of anorexia, A. fib/flutter, bipolar 1 disorder, bradycardia, CAD, hypothyroidism, palpitations, seizures, who presents the emergency department today under IVC for psychiatric evaluation.  Patient states that he was recently in a relationship with a woman and she broke up with them and then told him that she had cheated on him.  He was feeling very anxious and sad and called behavioral health.  He states that he was having thoughts of wanting to harm her for hurting him and he was concerned about his symptoms therefore he reached out to them.  Per their documentation he was also complaining of SI at that time however he denies this.  At the time of my evaluation he states he is feeling somewhat improved than how he was feeling yesterday and he no longer feels like he wants to hurt his exsignificant other and he continues to deny suicidal ideations.  He denies any auditory or visual hallucinations.  He reportedly went to be hooked up but left prior to being seen so the provider there IVC him due to his prior complaints of SI/HI.  He denies any drug or alcohol use.  He denies any medical complaints at this time.  Past Medical History:  Diagnosis Date   Anorexia    Atrial fib/flutter, transient    Back pain    Bipolar 1 disorder (HCC)    Bipolar disorder (HCC)    Bradycardia    Chest pain    Coronary artery disease    Hyperthyroidism    Palpitations    Seizures (HCC)    Tachyarrhythmia     Patient Active Problem List   Diagnosis Date Noted   Bipolar I disorder, most recent episode depressed (HCC) 03/24/2020   Moderate episode of recurrent major depressive disorder (HCC) 03/24/2020    Cough 02/06/2013   Itching 03/21/2011   Seasonal allergies 12/31/2010   HYPERTHYROIDISM 09/30/2010   BIPOLAR DISORDER UNSPECIFIED 09/30/2010   MYOCARDIAL INFARCTION, HX OF 11/02/2009   TOBACCO USER 05/30/2009    History reviewed. No pertinent surgical history.     Family History  Problem Relation Age of Onset   Heart disease Mother    Diabetes Mother    Heart failure Mother    Heart disease Father    Heart attack Father     Social History   Tobacco Use   Smoking status: Current Every Day Smoker    Packs/day: 0.25    Types: Cigarettes   Smokeless tobacco: Current User  Substance Use Topics   Alcohol use: No   Drug use: Yes    Types: Marijuana    Home Medications Prior to Admission medications   Medication Sig Start Date End Date Taking? Authorizing Provider  chlorhexidine (PERIDEX) 0.12 % solution Use as directed 15 mLs in the mouth or throat 2 (two) times daily. Swish and spit.  Do not swallow. 04/01/19   Aviva Kluver B, PA-C  fluconazole (DIFLUCAN) 200 MG tablet Take 1 tablet (200 mg total) by mouth daily. 05/20/20   Gilda Crease, MD  hydrOXYzine (ATARAX/VISTARIL) 25 MG tablet Take 1 tablet (25 mg total) by mouth 3 (three) times daily as needed. 06/03/20   Shanna Cisco, NP  mirtazapine (REMERON) 15 MG tablet Take 1 tablet (15 mg total) by mouth at bedtime. 06/03/20   Shanna Cisco, NP  oxcarbazepine (TRILEPTAL) 600 MG tablet Take 1 tablet (600 mg total) by mouth 2 (two) times daily. 06/03/20   Shanna Cisco, NP  PARoxetine (PAXIL) 20 MG tablet Take 1 tablet (20 mg total) by mouth daily. 06/03/20   Shanna Cisco, NP  QUEtiapine (SEROQUEL) 300 MG tablet Take 2 tablets (600 mg total) by mouth at bedtime. 06/03/20   Shanna Cisco, NP    Allergies    Penicillins  Review of Systems   Review of Systems  Constitutional: Negative for chills and fever.  HENT: Negative for ear pain and sore throat.   Eyes: Negative for  visual disturbance.  Respiratory: Negative for cough and shortness of breath.   Cardiovascular: Negative for chest pain.  Gastrointestinal: Negative for abdominal pain, constipation, diarrhea, nausea and vomiting.  Genitourinary: Negative for dysuria and hematuria.  Musculoskeletal: Negative for back pain.  Skin: Negative for color change and rash.  Neurological: Negative for headaches.  Psychiatric/Behavioral:       HI (resolved), Denies SI or AVH  All other systems reviewed and are negative.   Physical Exam Updated Vital Signs BP 113/76 (BP Location: Left Arm)    Pulse 72    Temp 98.2 F (36.8 C) (Oral)    Resp 20    SpO2 98%   Physical Exam Vitals and nursing note reviewed.  Constitutional:      Appearance: He is well-developed.  HENT:     Head: Normocephalic and atraumatic.  Eyes:     Conjunctiva/sclera: Conjunctivae normal.  Cardiovascular:     Rate and Rhythm: Normal rate and regular rhythm.     Heart sounds: No murmur heard.   Pulmonary:     Effort: Pulmonary effort is normal. No respiratory distress.     Breath sounds: Normal breath sounds.  Abdominal:     Palpations: Abdomen is soft.     Tenderness: There is no abdominal tenderness.  Musculoskeletal:     Cervical back: Neck supple.  Skin:    General: Skin is warm and dry.  Neurological:     Mental Status: He is alert.  Psychiatric:        Mood and Affect: Mood is anxious. Affect is labile and tearful.        Speech: Speech is rapid and pressured.        Thought Content: Thought content does not include homicidal or suicidal ideation. Thought content does not include homicidal or suicidal plan.     ED Results / Procedures / Treatments   Labs (all labs ordered are listed, but only abnormal results are displayed) Labs Reviewed  COMPREHENSIVE METABOLIC PANEL - Abnormal; Notable for the following components:      Result Value   Glucose, Bld 146 (*)    AST 14 (*)    All other components within normal limits   SALICYLATE LEVEL - Abnormal; Notable for the following components:   Salicylate Lvl <7.0 (*)    All other components within normal limits  ACETAMINOPHEN LEVEL - Abnormal; Notable for the following components:   Acetaminophen (Tylenol), Serum <10 (*)    All other components within normal limits  RESPIRATORY PANEL BY RT PCR (FLU A&B, COVID)  ETHANOL  CBC  RAPID URINE DRUG SCREEN, HOSP PERFORMED    EKG None  Radiology No results found.  Procedures Procedures (including critical care time)  Medications Ordered  in ED Medications  ziprasidone (GEODON) injection 20 mg (20 mg Intramuscular Not Given 06/17/20 2127)  sterile water (preservative free) injection (has no administration in time range)  hydrOXYzine (ATARAX/VISTARIL) tablet 25 mg (has no administration in time range)  mirtazapine (REMERON) tablet 15 mg (has no administration in time range)  Oxcarbazepine (TRILEPTAL) tablet 600 mg (has no administration in time range)  PARoxetine (PAXIL) tablet 20 mg (has no administration in time range)  QUEtiapine (SEROQUEL) tablet 600 mg (has no administration in time range)  LORazepam (ATIVAN) injection 1 mg (1 mg Intramuscular Given 06/17/20 1717)    ED Course  I have reviewed the triage vital signs and the nursing notes.  Pertinent labs & imaging results that were available during my care of the patient were reviewed by me and considered in my medical decision making (see chart for details).    MDM Rules/Calculators/A&P                          54 year old male presenting the emergency department today under IVC after reporting HI to providers at [facility] yesterday.  He reportedly had SI per their note however he denies this on my evaluation.  Reviewed/interpreted labs CBC unremarkable CMP without acute electrolyte derangement, normal liver and kidney function Acetaminophen, salicylate and EtOH level negative Covid negative  7:34 PM pt agitated, yelling at stfaff and threatening  staff. Attempted to order geodon for patient however he calmed down and this was help.    Pt without acute medical condition at this time that would require further workup or admission.   TTS evaluated the patient and recommends overnight observation.   The patient has been placed in psychiatric observation due to the need to provide a safe environment for the patient while obtaining psychiatric consultation and evaluation, as well as ongoing medical and medication management to treat the patient's condition.  The patient has been placed under full IVC at this time. Home meds ordered.  Final Clinical Impression(s) / ED Diagnoses Final diagnoses:  Encounter for psychological evaluation    Rx / DC Orders ED Discharge Orders    None       Rayne Du 06/17/20 2213    Tilden Fossa, MD 06/18/20 [phone]

## 2020-06-18 ENCOUNTER — Inpatient Hospital Stay (HOSPITAL_COMMUNITY): Admission: AD | Admit: 2020-06-18 | Payer: Federal, State, Local not specified - Other | Admitting: Psychiatry

## 2020-06-18 NOTE — ED Notes (Signed)
Pt called out stating "I got too much medicine..... I feel funny."  RN assessed pt, he was alert and oriented w/ stable vitals.  Pt's room was very warm, RN adjusted the temperature and gave pt an Ice pack for the back of his neck.  Pt stated he felt better.  Sitter still bedside

## 2020-06-18 NOTE — ED Provider Notes (Signed)
Emergency Medicine Observation Re-evaluation Note  Trey Gulbranson is a 54 y.o. male, seen on rounds today.  Pt initially presented to the ED for complaints of IVC and Psychiatric Evaluation Currently, the patient is resting in bed, no distress, awaiting psych reassessment.  Physical Exam  BP (!) 130/95 (BP Location: Right Arm)   Pulse 98   Temp 98.2 F (36.8 C) (Oral)   Resp 20   SpO2 100%  Physical Exam General: well appearing, resting in bed Cardiac: well perfused, warm Lungs: even and unlabored Psych: calm and cooperative at present  ED Course / MDM  EKG:    I have reviewed the labs performed to date as well as medications administered while in observation.  Recent changes in the last 24 hours include psych recommended overnight obs.  Plan  Current plan is for psych reassessment in am. Patient is under full IVC at this time.   Milagros Loll, MD 06/18/20 [phone]

## 2020-06-18 NOTE — Discharge Instructions (Signed)
Follow-up with your regular psychiatrist and therapist. Return to ER if you have thoughts of hurting herself or hurting others or start seeing or hearing things other people do not see or hear.

## 2020-06-18 NOTE — Progress Notes (Signed)
Patient ID: Kenneth Jimenez, male   DOB: 1966-06-03, 54 y.o.   MRN: [id number]   Psychiatric Reassessment   HPI: Kenneth Jimenez  Is a 54 year old male who presents to [facility] via [employer] under IVC. Pt reports that he was in a relationship for a few months and his significant other decided to split up with him and he became upset and lost control. Pt admits to passive homicidal thoughts towards significant other yesterday when incident occurred but states today is calm and not SI, HI, AVH or SIB. Pt reports no prior SI attempts. Per pt chart Patient went came to [facility] and left without being seen and was IVC by the provider there."  Pt reports he has a follow up appointment with therapist on October 18th and again with Doctor on the 23rd. Pt reports he is currently taking medications Seroquel, Paxil and a few others. Per pt chart Client reported a history of difficulty with mood swingsand "melt downs"that also affected his past dealings with the legal system. Client reported the symptoms have affected his interpersonal relationships.  Pt currently under IVC by MD at this time.   Per IVC:  Provider called patient on 06/16/21 - was informed that he was having SI/HI. He noted that he was increasingly depressed due to relationship issues, stated he wanted to harm himself and his significant other. He came into [facility] however left without being seen. Patient is a danger to himself and others.  Psychiatric Evaluation 06/18/2020: Kenneth Jimenez  Is a 54 year old male with a psychiatric history significant for depression, Bipolar  disorder, PTSD, and explosive disorder per self-report.  Patient was taken to [facility] after being IVC'd following reports of SI and HI and the context of relationship issues.   During this evaluation, patient was alert and oriented x4 and cooperative.  Patient admitted that prior to presenting to the ED, he had both suicidal and homicidal thoughts. Reported, he spent time with is girlfriend  the  entire weekend, then on Monday, his girlfriend sent him a text reading," Its over. I don't want to be with me anymore." He stated," then she had a nerve to tell me that she had another man on the side and that set me off." He stated that at that time, he could not control his thoughts so he went to see his psychiatric outpatient provider. Stated when he discussed his thoughts with his provider, she recommended that he was further evaluated by the [facility] team. Stated he spoke to someone at the facility although he left after speaking to them as he thought the evaluation was over. Stated shortly afterwards, he was told that he was IVC'd by his provider as she felt as though he was a danger to himself and others thus,he was transported to the ED.   He denied current SI, HI and psychosis. He stated," I ain't gone lie I was devastated at first but now I am good." He went on to disclose some difficulties with mood swings however, denied feelings of depression. He denied prior SA or NSSIB or inpatient psychiatric hospitalizations. On top of having an outpatient psychiatric provider, he stated that he has a therapist at the [facility] and reported his next appointment is 06/19/2020 with a medication appointment for 10/13,2021. He was unable to recall current medication but per chart review current medications are Seroquel, Paxil, Remeron, and Trileptal. He denied access to guns.He gave verbal consent to speak to his girlfriends sister, Kenneth Jimenez  Kenneth Jimenez, [phone] who he stated initially took him to the [facility] for evaluation.   I spoke to Kenneth Jimenez, who initially stated that she felt as though patient was manic, " at least the way he presented yesterday." She stated that yesterday, patient seemed to be in  arage following relationship issues between patient and his sister. She added," he was just so angry and had spells of crying and he did make threats to go harm others however, we went to see  his provider before he did."  Due to [facility] scheduled  morning huddle, I was unable to complete the conversation and Ms. Kenneth Jimenez was advised that I would contact following the meeting to continue obtaining additional information.  I spoke to Kenneth Jimenez at 11:55 am for follow-up. At that time she stated," I spoke to Kenneth Jimenez and it seems as though he has calmed down and less angry. I know he has upcoming appointment with his therapist and medication provider and I think he will be ok until then. I will make sure he takes his medications and get to his appointment and if he does go into a rage or seems more agreer, I will make sure I call the police or get him back to the ED. After speaking to him seeing that he has calmed down, I do not think he will harm himself or others."   Disposition: Patient denies current SI, HI and psychosis. He has had no significant outburst or violent behaviors while in the ED. His psychiatric history is significant for depression, Bipolar  disorder, PTSD, and explosive disorder which is being managed by an outpatient psychiatric team. He has appointments with his outpatient team ([facility] and medication management) 06/19/2020 and 06/24/2020 which was verified by his collateral contact. Per collateral contact, patient is much calmer than prior and she does not believe that patient is a danger to himself or others as this time. Per collateral contact, if patient does become a danger to himself, she will call 911 or take him back to the ED. Collateral supports endorsed no other concerns at this time.   Case discussed with Dr. Lucianne Muss. Because patient no linger endorses SI and HI and he is no psychotic, he will not meet criteria for an inpatient psychiatric admission. Patient sis psychiatrically cleared with additional plan of care to follow up with current outpatient psychiatric services for ongoing mental health needs.   ED updated on disposition.

## 2020-06-19 ENCOUNTER — Ambulatory Visit (HOSPITAL_COMMUNITY): Payer: No Payment, Other | Admitting: Clinical

## 2020-08-24 ENCOUNTER — Encounter (HOSPITAL_COMMUNITY): Payer: Self-pay | Admitting: Psychiatry

## 2020-08-24 ENCOUNTER — Telehealth (INDEPENDENT_AMBULATORY_CARE_PROVIDER_SITE_OTHER): Payer: No Payment, Other | Admitting: Psychiatry

## 2020-08-24 ENCOUNTER — Other Ambulatory Visit: Payer: Self-pay

## 2020-08-24 DIAGNOSIS — F331 Major depressive disorder, recurrent, moderate: Secondary | ICD-10-CM

## 2020-08-24 DIAGNOSIS — F3130 Bipolar disorder, current episode depressed, mild or moderate severity, unspecified: Secondary | ICD-10-CM | POA: Diagnosis not present

## 2020-08-24 MED ORDER — LAMOTRIGINE 25 MG PO TABS: 25.0000 mg | ORAL_TABLET | Freq: Every day | ORAL | 2 refills | Status: DC

## 2020-08-24 MED ORDER — QUETIAPINE FUMARATE 400 MG PO TABS: 400.0000 mg | ORAL_TABLET | Freq: Every day | ORAL | 2 refills | Status: DC

## 2020-08-24 MED ORDER — HYDROXYZINE HCL 25 MG PO TABS: 25.0000 mg | ORAL_TABLET | Freq: Three times a day (TID) | ORAL | 2 refills | Status: DC | PRN

## 2020-08-24 MED ORDER — MIRTAZAPINE 15 MG PO TABS: 15.0000 mg | ORAL_TABLET | Freq: Every day | ORAL | 2 refills | Status: DC

## 2020-08-24 MED ORDER — PAROXETINE HCL 20 MG PO TABS: 30.0000 mg | ORAL_TABLET | Freq: Every day | ORAL | 2 refills | Status: DC

## 2020-08-24 NOTE — Progress Notes (Signed)
BH MD/PA/NP OP Progress Note Virtual Visit via Video Note  I connected with Kenneth Jimenez on 08/24/20 at  3:30 PM EST by a video enabled telemedicine application and verified that I am speaking with the correct person using two identifiers.  Location: Patient: Home Provider: Clinic   I discussed the limitations of evaluation and management by telemedicine and the availability of in person appointments. The patient expressed understanding and agreed to proceed.  I provided 30 minutes of non-face-to-face time during this encounter.    08/24/2020 4:07 PM Kenneth Jimenez  MRN:  [id number]  Chief Complaint: "I don't think the medications are working.  HPI: 54 year old male seen today for follow up psychiatric evaluation.   He has a psychiatric history of bipolar disorder, PTSD, explosive disorder, depression, and cannabis use disorder.  Patient is currently prescribed Trileptal 600 mg twice daily, mirtazapine 15 mg at bedtime, hydroxyzine 25 mg 3 times daily as needed, Paxil 20 mg daily, and Seroquel 600 HS.  Today patient is talkative, tearful, well groomed, pleasant, cooperative, engaged in conversation, and maintained eye contact. He describes his mood as anxious and depressed. He notes that his mood maybe because of his recent breakup with his girlfriend.  He notes that after she broke up with him he had her placed in jail and notes that she has not returned his calls since.  He also informed Clinical research associate that he believes she may be pregnant and wants to talk to her about this.  He informed provider that recently he has been more irritable, distractible, having racing thoughts, actuations in mood, and decreased sleep (noting that he sleeps 4 to 5 hours nightly).  Patient informed provider that he has been on Trileptal for 3 years and notes that the has not seen an improvement in mood stabilization.  He also noted that although he is having reduced sleep he is feeling overly sedated on Seroquel.   Today he denies SI/HI/VH or paranoia.    Patient agreeable to reduce Trileptal 600 mg twice daily to 600 mg daily for a week.  He will then discontinue it after a week.  He notes that he has enough medications to taper his dose.  He will then start Lamictal 25 mg daily for a week and then increase to 50 mg to help stabilize mood.  He informed provider that Depakote was ineffective in the past for  stabilizing his mood.  He is also agreeable to reducing Seroquel 600 mg to 400 mg to reduce sedation.  He will continue all other medications as prescribed.  He will follow up with counselor for therapy.  No other concerns at this time.    Visit Diagnosis:    ICD-10-CM   1. Moderate episode of recurrent major depressive disorder (HCC)  F33.1 mirtazapine (REMERON) 15 MG tablet    PARoxetine (PAXIL) 20 MG tablet    hydrOXYzine (ATARAX/VISTARIL) 25 MG tablet  2. Bipolar I disorder, most recent episode depressed (HCC)  F31.30 QUEtiapine (SEROQUEL) 400 MG tablet    lamoTRIgine (LAMICTAL) 25 MG tablet    Past Psychiatric History:  bipolar disorder, PTSD, explosive disorder, depression, and cannabis use disorder  Past Medical History:  Past Medical History:  Diagnosis Date  . Anorexia   . Atrial fib/flutter, transient   . Back pain   . Bipolar 1 disorder (HCC)   . Bipolar disorder (HCC)   . Bradycardia   . Chest pain   . Coronary artery disease   . Hyperthyroidism   . Palpitations   .  Seizures (HCC)   . Tachyarrhythmia    No past surgical history on file.  Family Psychiatric History:  PTSD, Depression, Bipolar disorder,   Family History:  Family History  Problem Relation Age of Onset  . Heart disease Mother   . Diabetes Mother   . Heart failure Mother   . Heart disease Father   . Heart attack Father     Social History:  Social History   Socioeconomic History  . Marital status: Married    Spouse name: Not on file  . Number of children: Not on file  . Years of education: Not on  file  . Highest education level: Not on file  Occupational History  . Not on file  Tobacco Use  . Smoking status: Current Every Day Smoker    Packs/day: 0.25    Types: Cigarettes  . Smokeless tobacco: Current User  Substance and Sexual Activity  . Alcohol use: No  . Drug use: Yes    Types: Marijuana  . Sexual activity: Yes  Other Topics Concern  . Not on file  Social History Narrative   Currently working to get medicaid.    Social Determinants of Health   Financial Resource Strain: Not on file  Food Insecurity: Not on file  Transportation Needs: Not on file  Physical Activity: Not on file  Stress: Not on file  Social Connections: Not on file    Allergies:  Allergies  Allergen Reactions  . Penicillins Hives    Metabolic Disorder Labs: No results found for: HGBA1C, MPG No results found for: PROLACTIN No results found for: CHOL, TRIG, HDL, CHOLHDL, VLDL, LDLCALC Lab Results  Component Value Date   TSH 0.381 02/06/2013   TSH 0.376 03/21/2011    Therapeutic Level Labs: No results found for: LITHIUM Lab Results  Component Value Date   VALPROATE <10.0 (L) 12/22/2014   VALPROATE <10.0 (L) 09/11/2010   No components found for:  CBMZ  Current Medications: Current Outpatient Medications  Medication Sig Dispense Refill  . chlorhexidine (PERIDEX) 0.12 % solution Use as directed 15 mLs in the mouth or throat 2 (two) times daily. Swish and spit.  Do not swallow. (Patient not taking: Reported on 06/18/2020) 120 mL 0  . fluconazole (DIFLUCAN) 200 MG tablet Take 1 tablet (200 mg total) by mouth daily. (Patient not taking: Reported on 06/18/2020) 7 tablet 0  . hydrOXYzine (ATARAX/VISTARIL) 25 MG tablet Take 1 tablet (25 mg total) by mouth 3 (three) times daily as needed. 30 tablet 2  . lamoTRIgine (LAMICTAL) 25 MG tablet Take 1 tablet (25 mg total) by mouth daily. 30 tablet 2  . mirtazapine (REMERON) 15 MG tablet Take 1 tablet (15 mg total) by mouth at bedtime. 30 tablet 2  .  PARoxetine (PAXIL) 20 MG tablet Take 1.5 tablets (30 mg total) by mouth daily. 30 tablet 2  . QUEtiapine (SEROQUEL) 400 MG tablet Take 1 tablet (400 mg total) by mouth at bedtime. 30 tablet 2   No current facility-administered medications for this visit.     Musculoskeletal: Strength & Muscle Tone: Unable to assess due to telehealth visit Gait & Station: Unable to assess due to telehealth visit Patient leans: N/A  Psychiatric Specialty Exam: Review of Systems  There were no vitals taken for this visit.There is no height or weight on file to calculate BMI.  General Appearance: Well Groomed  Eye Contact:  Good  Speech:  Clear and Coherent and Normal Rate  Volume:  Normal  Mood:  Anxious,  Depressed and Irritable  Affect:  Appropriate and Congruent  Thought Process:  Coherent, Goal Directed and Linear  Orientation:  Full (Time, Place, and Person)  Thought Content: WDL and Logical   Suicidal Thoughts:  No  Homicidal Thoughts:  No  Memory:  Immediate;   Good Recent;   Good Remote;   Good  Judgement:  Good  Insight:  Good  Psychomotor Activity:  Normal  Concentration:  Concentration: Good and Attention Span: Good  Recall:  Good  Fund of Knowledge: Good  Language: Good  Akathisia:  No  Handed:  Left  AIMS (if indicated): Not done  Assets:  Communication Skills Desire for Improvement Financial Resources/Insurance Housing Intimacy Leisure Time Social Support  ADL's:  Intact  Cognition: WNL  Sleep:  Fair   Screenings: Administrator, sports from [facility]/26/2020 [facility]  PHQ-2 Total Score 2  PHQ-9 Total Score 5       Assessment and Plan: Patient endorses worsening irritability, anxiety, hypomania, and depression. He is agreeable to discontinue Trileptal 600 mg twice daily (patient instructed to take one pills a day for a week and then discontinue after a week). He will then start Lamictal 25 mg once discontinuing Trileptal for two  week and then increased to 50 mg daily. Patient will also reduce Seroquel 600 mg to 400 mg to help improve sedation.   1. Moderate episode of recurrent major depressive disorder (HCC)  Continue- mirtazapine (REMERON) 15 MG tablet; Take 1 tablet (15 mg total) by mouth at bedtime.  Dispense: 30 tablet; Refill: 2 Continue- PARoxetine (PAXIL) 20 MG tablet; Take 1.5 tablets (30 mg total) by mouth daily.  Dispense: 30 tablet; Refill: 2 Continue- hydrOXYzine (ATARAX/VISTARIL) 25 MG tablet; Take 1 tablet (25 mg total) by mouth 3 (three) times daily as needed.  Dispense: 30 tablet; Refill: 2  2. Bipolar I disorder, most recent episode depressed (HCC)  Reduced- QUEtiapine (SEROQUEL) 400 MG tablet; Take 1 tablet (400 mg total) by mouth at bedtime.  Dispense: 30 tablet; Refill: 2 Start- lamoTRIgine (LAMICTAL) 25 MG tablet; Take 1 tablet (25 mg total) by mouth daily.  Dispense: 30 tablet; Refill: 2   Follow up in 1 months Follow up with outpatient therapist.   Shanna Cisco, NP 08/24/2020, 4:07 PM

## 2020-08-25 ENCOUNTER — Ambulatory Visit (INDEPENDENT_AMBULATORY_CARE_PROVIDER_SITE_OTHER): Payer: No Payment, Other | Admitting: Clinical

## 2020-08-25 DIAGNOSIS — F3130 Bipolar disorder, current episode depressed, mild or moderate severity, unspecified: Secondary | ICD-10-CM

## 2020-08-25 MED FILL — QUETIAPINE FUMARATE 400 MG: 400 | 30 days supply | Qty: 30 | Fill #0

## 2020-08-25 MED FILL — hydrOXYzine HCL 25 MG TABS: 25 | 10 days supply | Qty: 30 | Fill #0

## 2020-08-25 MED FILL — lamoTRIgine 25 MG TABS: 25 | 30 days supply | Qty: 30 | Fill #0

## 2020-08-25 MED FILL — MIRTAZAPINE 15 MG TABLET: 15 | 30 days supply | Qty: 30 | Fill #0

## 2020-08-25 MED FILL — PARoxetine HCL 20 MG TABS: 20 | 15 days supply | Qty: 30 | Fill #0
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
## 2020-09-28 ENCOUNTER — Telehealth (INDEPENDENT_AMBULATORY_CARE_PROVIDER_SITE_OTHER): Payer: No Payment, Other | Admitting: Psychiatry

## 2020-09-28 ENCOUNTER — Other Ambulatory Visit (HOSPITAL_COMMUNITY): Payer: Self-pay | Admitting: Psychiatry

## 2020-09-28 ENCOUNTER — Other Ambulatory Visit: Payer: Self-pay

## 2020-09-28 ENCOUNTER — Encounter (HOSPITAL_COMMUNITY): Payer: Self-pay | Admitting: Psychiatry

## 2020-09-28 DIAGNOSIS — F3130 Bipolar disorder, current episode depressed, mild or moderate severity, unspecified: Secondary | ICD-10-CM | POA: Diagnosis not present

## 2020-09-28 DIAGNOSIS — F331 Major depressive disorder, recurrent, moderate: Secondary | ICD-10-CM | POA: Diagnosis not present

## 2020-09-28 MED ORDER — HYDROXYZINE HCL 25 MG PO TABS: 25.0000 mg | ORAL_TABLET | Freq: Three times a day (TID) | ORAL | 2 refills | Status: DC | PRN

## 2020-09-28 MED ORDER — QUETIAPINE FUMARATE 400 MG PO TABS: 400.0000 mg | ORAL_TABLET | Freq: Every day | ORAL | 2 refills | Status: DC

## 2020-09-28 MED ORDER — LAMOTRIGINE 25 MG PO TABS: 25.0000 mg | ORAL_TABLET | Freq: Every day | ORAL | 2 refills | Status: DC

## 2020-09-28 MED ORDER — PAROXETINE HCL 20 MG PO TABS: 30.0000 mg | ORAL_TABLET | Freq: Every day | ORAL | 2 refills | Status: DC

## 2020-09-28 MED ORDER — MIRTAZAPINE 15 MG PO TABS: 15.0000 mg | ORAL_TABLET | Freq: Every day | ORAL | 2 refills | Status: DC

## 2020-09-28 MED FILL — hydrOXYzine HCL 25 MG TABS: 25 | 10 days supply | Qty: 30 | Fill #0

## 2020-09-28 MED FILL — QUETIAPINE FUMARATE 400 MG: 400 | 30 days supply | Qty: 30 | Fill #0

## 2020-09-28 MED FILL — MIRTAZAPINE 15 MG TABLET: 15 | 30 days supply | Qty: 30 | Fill #0

## 2020-09-28 MED FILL — PARoxetine HCL 20 MG TABS: 20 | 20 days supply | Qty: 30 | Fill #0

## 2020-09-28 MED FILL — lamoTRIgine 25 MG TABS: 25 | 30 days supply | Qty: 30 | Fill #0

## 2020-09-28 NOTE — Progress Notes (Signed)
BH MD/PA/NP OP Progress Note Virtual Visit via Telephone Note  I connected with Kenneth Jimenez on 09/28/20 at  4:00 PM EST by telephone and verified that I am speaking with the correct person using two identifiers.  Location: Patient: home Provider: Clinic   I discussed the limitations, risks, security and privacy concerns of performing an evaluation and management service by telephone and the availability of in person appointments. I also discussed with the patient that there may be a patient responsible charge related to this service. The patient expressed understanding and agreed to proceed.   I provided 30 minutes of non-face-to-face time during this encounter.    09/28/2020 12:44 PM Kenneth Jimenez  MRN:  [id number]  Chief Complaint: "I didn't have money to get my medication"   HPI: 55 year old male seen today for follow up psychiatric evaluation.   He has a psychiatric history of bipolar disorder, PTSD, explosive disorder, depression, and cannabis use disorder.  Patient is currently prescribed  mirtazapine 15 mg at bedtime, Lamictal 50 mg, hydroxyzine 25 mg 3 times daily as needed, Paxil 20 mg daily, and Seroquel 400 HS.  Today patient is unable to login virtually so his exam was done over the phone. On exam he is talkative, irritable, cooperative, and somewhat engaged in conversation. He informed provider that he has been out of his medications for a month because he could not afford them. Provider asked patient if he visited community health and wellness and he notes he had but reports the line was to long and he left. He then notes that he called and was told that he had to pay for his medications so he never picked them up. Provider informed patient to ask about the patient care assistance program. He notes that he would call them back and try. Patient also notes that he is dissatisfied with is peer specialist at [facility]. He notes the hasn't been active and is ignoring him. Provider  informed patient that because he is receiving care at [facility] he may no longer be able to see his peer specialist at [facility]. Patient notes that he still can receive services.  Patient notes that because of the above stressors he has been more anxious and depressed. Provider informed patient that she had informed the pharmacy that he was in need of patient care assistance. Provider also discussed with nursing staff to assist patient. He endorsed understanding and noted that he would attempt to get in contact with someone tomorrow regarding this. Today he denies SI/HI/VAH or paranoia. He endorses adequate sleep and appetite.  No medication changes made today. Patient agreeable to restart medications as prescribed.      Visit Diagnosis:    ICD-10-CM   1. Bipolar I disorder, most recent episode depressed (HCC)  F31.30 QUEtiapine (SEROQUEL) 400 MG tablet    lamoTRIgine (LAMICTAL) 25 MG tablet  2. Moderate episode of recurrent major depressive disorder (HCC)  F33.1 mirtazapine (REMERON) 15 MG tablet    hydrOXYzine (ATARAX/VISTARIL) 25 MG tablet    PARoxetine (PAXIL) 20 MG tablet    Past Psychiatric History:  bipolar disorder, PTSD, explosive disorder, depression, and cannabis use disorder  Past Medical History:  Past Medical History:  Diagnosis Date  . Anorexia   . Atrial fib/flutter, transient   . Back pain   . Bipolar 1 disorder (HCC)   . Bipolar disorder (HCC)   . Bradycardia   . Chest pain   . Coronary artery disease   . Hyperthyroidism   . Palpitations   .  Seizures (HCC)   . Tachyarrhythmia    No past surgical history on file.  Family Psychiatric History:  PTSD, Depression, Bipolar disorder,   Family History:  Family History  Problem Relation Age of Onset  . Heart disease Mother   . Diabetes Mother   . Heart failure Mother   . Heart disease Father   . Heart attack Father     Social History:  Social History   Socioeconomic History  . Marital status: Married    Spouse  name: Not on file  . Number of children: Not on file  . Years of education: Not on file  . Highest education level: Not on file  Occupational History  . Not on file  Tobacco Use  . Smoking status: Current Every Day Smoker    Packs/day: 0.25    Types: Cigarettes  . Smokeless tobacco: Current User  Substance and Sexual Activity  . Alcohol use: No  . Drug use: Yes    Types: Marijuana  . Sexual activity: Yes  Other Topics Concern  . Not on file  Social History Narrative   Currently working to get medicaid.    Social Determinants of Health   Financial Resource Strain: Not on file  Food Insecurity: Not on file  Transportation Needs: Not on file  Physical Activity: Not on file  Stress: Not on file  Social Connections: Not on file    Allergies:  Allergies  Allergen Reactions  . Penicillins Hives    Metabolic Disorder Labs: No results found for: HGBA1C, MPG No results found for: PROLACTIN No results found for: CHOL, TRIG, HDL, CHOLHDL, VLDL, LDLCALC Lab Results  Component Value Date   TSH 0.381 02/06/2013   TSH 0.376 03/21/2011    Therapeutic Level Labs: No results found for: LITHIUM Lab Results  Component Value Date   VALPROATE <10.0 (L) 12/22/2014   VALPROATE <10.0 (L) 09/11/2010   No components found for:  CBMZ  Current Medications: Current Outpatient Medications  Medication Sig Dispense Refill  . chlorhexidine (PERIDEX) 0.12 % solution Use as directed 15 mLs in the mouth or throat 2 (two) times daily. Swish and spit.  Do not swallow. (Patient not taking: Reported on 06/18/2020) 120 mL 0  . fluconazole (DIFLUCAN) 200 MG tablet Take 1 tablet (200 mg total) by mouth daily. (Patient not taking: Reported on 06/18/2020) 7 tablet 0  . hydrOXYzine (ATARAX/VISTARIL) 25 MG tablet Take 1 tablet (25 mg total) by mouth 3 (three) times daily as needed. 30 tablet 2  . lamoTRIgine (LAMICTAL) 25 MG tablet Take 1 tablet (25 mg total) by mouth daily. 45 tablet 2  . mirtazapine  (REMERON) 15 MG tablet Take 1 tablet (15 mg total) by mouth at bedtime. 30 tablet 2  . PARoxetine (PAXIL) 20 MG tablet Take 1.5 tablets (30 mg total) by mouth daily. 30 tablet 2  . QUEtiapine (SEROQUEL) 400 MG tablet Take 1 tablet (400 mg total) by mouth at bedtime. 30 tablet 2   No current facility-administered medications for this visit.     Musculoskeletal: Strength & Muscle Tone: Unable to assess due to telephone visit Gait & Station: Unable to assess due to telephone visit Patient leans: N/A  Psychiatric Specialty Exam: Review of Systems  There were no vitals taken for this visit.There is no height or weight on file to calculate BMI.  General Appearance: Unable to assess due to telephone visit  Eye Contact:  Unable to assess due to telephone visit  Speech:  Clear and Coherent  and Normal Rate  Volume:  Normal  Mood:  Anxious, Depressed and Irritable  Affect:  Appropriate and Congruent  Thought Process:  Coherent, Goal Directed and Linear  Orientation:  Full (Time, Place, and Person)  Thought Content: WDL and Logical   Suicidal Thoughts:  No  Homicidal Thoughts:  No  Memory:  Immediate;   Good Recent;   Good Remote;   Good  Judgement:  Good  Insight:  Good  Psychomotor Activity:  Normal  Concentration:  Concentration: Good and Attention Span: Good  Recall:  Good  Fund of Knowledge: Good  Language: Good  Akathisia:  No  Handed:  Left  AIMS (if indicated): Not done  Assets:  Communication Skills Desire for Improvement Financial Resources/Insurance Housing Intimacy Leisure Time Social Support  ADL's:  Intact  Cognition: WNL  Sleep:  Good   Screenings: Administrator, sports [facility]/26/2020 in [facility]  PHQ-2 Total Score 2  PHQ-9 Total Score 5       Assessment and Plan: Patient endorses worsening irritability, anxiety,and depression since being out of his medications. No medication changes made today. Patient  agreeable to restart medications as prescribed. He will contact community health and wellness regarding patient care assistance.     1. Moderate episode of recurrent major depressive disorder (HCC)  Continue- mirtazapine (REMERON) 15 MG tablet; Take 1 tablet (15 mg total) by mouth at bedtime.  Dispense: 30 tablet; Refill: 2 Continue- PARoxetine (PAXIL) 20 MG tablet; Take 1.5 tablets (30 mg total) by mouth daily.  Dispense: 30 tablet; Refill: 2 Continue- hydrOXYzine (ATARAX/VISTARIL) 25 MG tablet; Take 1 tablet (25 mg total) by mouth 3 (three) times daily as needed.  Dispense: 30 tablet; Refill: 2  2. Bipolar I disorder, most recent episode depressed (HCC)  Restart- QUEtiapine (SEROQUEL) 400 MG tablet; Take 1 tablet (400 mg total) by mouth at bedtime.  Dispense: 30 tablet; Refill: 2 Start- lamoTRIgine (LAMICTAL) 25 MG tablet; Take 1 tablet (25 mg total) by mouth daily.  Dispense: 30 tablet; Refill: 2   Follow up in 2 months Follow up with outpatient therapist.   Shanna Cisco, NP 09/28/2020, 12:44 PM

## 2020-10-05 ENCOUNTER — Telehealth (HOSPITAL_COMMUNITY): Payer: Self-pay
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
## 2020-10-08 ENCOUNTER — Encounter (HOSPITAL_COMMUNITY): Payer: Self-pay | Admitting: Psychiatry

## 2020-10-08 NOTE — Telephone Encounter (Signed)
Patient notes that he is unable to afford his medication and has not taken them. He notes that he received his medications for free for 18 years from [place] but no longer can. Provider informed patient that [facility] health and wellness can no longer fill his medications for free either. Patient notes that he may want his medications sent to the health department if they are cheaper. He notes that he would like provider to call the health department. Provider instructed patient to call the health department himself and inform provider if he would like his medications sent there. He endorsed understanding and agreed. He also asked provider to collect funds for his at church. Provider informed patient that that could not be done. He then asked provider to writer a letter to excuse his from court on 10/12/2020 because he feels unstable. Provider wrote a letter detailing patients current condition. No other concerns noted at this time.

## 2020-10-09 ENCOUNTER — Other Ambulatory Visit (HOSPITAL_COMMUNITY): Payer: Self-pay | Admitting: *Deleted

## 2020-10-09 ENCOUNTER — Telehealth (HOSPITAL_COMMUNITY): Payer: Self-pay | Admitting: *Deleted

## 2020-10-09 NOTE — Telephone Encounter (Signed)
Requesting [place] DMP write a letter on his behalf to Mrs Channing Mutters, [place] and fax it to [phone]. States he spoke with Dr Doyne Keel yesterday but the letter wasn't in his my chart so for it to get to court on time needs now to be faxed. Will bring it to her attention.

## 2020-10-09 NOTE — Telephone Encounter (Signed)
Faxed and [name] notified by phone it was faxed.

## 2020-10-09 NOTE — Telephone Encounter (Signed)
Letter written yesterday and is in Mychart. Nursing staff will fax letter to the DA.

## 2020-10-22 ENCOUNTER — Other Ambulatory Visit: Payer: Self-pay

## 2020-10-22 ENCOUNTER — Ambulatory Visit (HOSPITAL_COMMUNITY): Payer: No Payment, Other | Admitting: Clinical

## 2020-10-23 ENCOUNTER — Other Ambulatory Visit: Payer: Self-pay

## 2020-10-23 ENCOUNTER — Ambulatory Visit (INDEPENDENT_AMBULATORY_CARE_PROVIDER_SITE_OTHER): Payer: No Payment, Other | Admitting: Clinical

## 2020-10-23 DIAGNOSIS — F3130 Bipolar disorder, current episode depressed, mild or moderate severity, unspecified: Secondary | ICD-10-CM
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
## 2020-11-19 ENCOUNTER — Telehealth (HOSPITAL_COMMUNITY): Payer: Self-pay | Admitting: Psychiatry

## 2020-11-19 ENCOUNTER — Other Ambulatory Visit: Payer: Self-pay

## 2020-11-19 ENCOUNTER — Ambulatory Visit (INDEPENDENT_AMBULATORY_CARE_PROVIDER_SITE_OTHER): Payer: No Payment, Other | Admitting: Clinical

## 2020-11-19 DIAGNOSIS — F3130 Bipolar disorder, current episode depressed, mild or moderate severity, unspecified: Secondary | ICD-10-CM

## 2020-11-19 MED FILL — hydrOXYzine HCL 25 MG TABS: 25 | 10 days supply | Qty: 30 | Fill #0

## 2020-11-19 MED FILL — lamoTRIgine 25 MG TABS: 25 | 30 days supply | Qty: 30 | Fill #0

## 2020-11-19 MED FILL — ?PAROXETINE HCL 20 MG TABLE: 20 | 20 days supply | Qty: 30 | Fill #0

## 2020-11-19 MED FILL — QUETIAPINE FUMARATE 400 MG: 400 | 30 days supply | Qty: 30 | Fill #0

## 2020-11-19 MED FILL — ?MIRTAZAPINE 15 MG TABLET: 15 | 30 days supply | Qty: 30 | Fill #0

## 2020-11-19 NOTE — Telephone Encounter (Signed)
Patient spoke to his counselor today and informed her that he is still having difficulties paying for his medication.  Provider spoke to [employer] (patient counselor) regarding this matter.  Provider also spoke to [facility] administrative staff to inform them of this issue.  All parties collaborated and spoke to [employer] at [employer].  After collaboration it was determined that Mr. Kenneth Jimenez could utilize his 1 year of refills.  Provider then called patient and informed him that his medications maybe $18-$22 through the patient care assistance program. Provider also informed patient that he maybe able to get his medications for free through [facility].  Patient informed that he has a follow-up with Tresa Endo at [facility] to begin this process.  He endorsed understanding and agreed.  He also notes that if he has to come up with $18-$22 that he will be able to. Patient also informed that if he is unable to completed his forms at [facility] staff members at [facility] behavioral health will be able and willing to help him.  He endorsed understanding.  No other concerns noted at this time.

## 2020-11-21 NOTE — Progress Notes (Signed)
   THERAPIST PROGRESS NOTE Virtual Visit via Telephone Note  I connected with [name] on 11/19/2020 at 10:00 AM EST by telephone and verified that I am speaking with the correct person using two identifiers.  Location: Patient: home Provider: office   I discussed the limitations, risks, security and privacy concerns of performing an evaluation and management service by telephone and the availability of in person appointments. I also discussed with the patient that there may be a patient responsible charge related to this service. The patient expressed understanding and agreed to proceed.   Follow Up Instructions: I discussed the assessment and treatment plan with the patient. The patient was provided an opportunity to ask questions and all were answered. The patient agreed with the plan and demonstrated an understanding of the instructions.   The patient was advised to call back or seek an in-person evaluation if the symptoms worsen or if the condition fails to improve as anticipated.   Session Time: 40 minutes  Participation Level: Active  Behavioral Response: NAAlertAngry  Type of Therapy: Individual Therapy  Treatment Goals addressed: Anger  Interventions: CBT  Summary:  Emric Kowalewski is a 55 y.o. male who presents for the scheduled session oriented times five and cooperative. Client denied hallucinations and delusions. Client reported on today feeling irritable. Client reported since the last session he still has been unable to get his medication due to finances. Client reported he has been very angry with everybody he has encountered. Client reported because he has been out of medication he has been trying very hard not to escalate situations unnecessarily that could cause him more trouble than he would rationally want to. Client reported he has been stressed lately because his twin daughters are being placed in to foster care if he cannot find a place with his housing  voucher soon. Client reported that is his priority but has been irritated by his children's mother actions during the care of the children. Client reported he is also facing short term jail stay related to a women he was previously in a relationship with and is waiting to hear about what he needs to do next. Client reported his thoughts have been "Spinning" describing himself as feeling like anything someone does could send him over the edge. Client reported having improved insight into his impulses by stating, "I can't fight people you need to help you". Client reported he has been trying to think and consider consequences before he reacts. Client reported he has also had some shooting pain in his back that reoccurs since he fell some years ago.    Suicidal/Homicidal: Nowithout intent/plan  Therapist Response:  Therapist began the session checking in and asking the client how he has been doing since the last session. Therapist actively listened to the clients concerns. Therapist engaged with the client to discuss his use of taught skills to manage anger. Therapist used CBT to encourage the client to continue to use his protective factors, walking away from situations, and deep breathing. Client was scheduled for next appointment.      Plan: Return again in 4 weeks for individual therapy. Therapist will get in contact with the clients psychiatrist, nurse and management to discuss solutions for the clients concern about inability to get medication.  Diagnosis: Bipolar 1 disorder, most recent episode depressed  Neena Rhymes Cozart, LCSW 11/19/2020

## 2020-11-26 ENCOUNTER — Other Ambulatory Visit: Payer: Self-pay

## 2020-11-26 ENCOUNTER — Other Ambulatory Visit (HOSPITAL_COMMUNITY): Payer: Self-pay | Admitting: Psychiatry

## 2020-11-26 ENCOUNTER — Encounter (HOSPITAL_COMMUNITY): Payer: Self-pay | Admitting: Psychiatry

## 2020-11-26 ENCOUNTER — Telehealth (INDEPENDENT_AMBULATORY_CARE_PROVIDER_SITE_OTHER): Payer: No Payment, Other | Admitting: Psychiatry

## 2020-11-26 DIAGNOSIS — F331 Major depressive disorder, recurrent, moderate: Secondary | ICD-10-CM

## 2020-11-26 DIAGNOSIS — F3130 Bipolar disorder, current episode depressed, mild or moderate severity, unspecified: Secondary | ICD-10-CM

## 2020-11-26 MED ORDER — HYDROXYZINE HCL 25 MG PO TABS: 25.0000 mg | ORAL_TABLET | Freq: Three times a day (TID) | ORAL | 2 refills | Status: DC | PRN

## 2020-11-26 MED ORDER — MIRTAZAPINE 15 MG PO TABS: 15.0000 mg | ORAL_TABLET | Freq: Every day | ORAL | 2 refills | Status: DC

## 2020-11-26 MED ORDER — PAROXETINE HCL 40 MG PO TABS: 40.0000 mg | ORAL_TABLET | Freq: Every day | ORAL | 2 refills | Status: DC

## 2020-11-26 MED ORDER — QUETIAPINE FUMARATE 400 MG PO TABS: 400.0000 mg | ORAL_TABLET | Freq: Every day | ORAL | 2 refills | Status: DC

## 2020-11-26 NOTE — Progress Notes (Signed)
BH MD/PA/NP OP Progress Note Virtual Visit via Video Note  I connected with Kenneth Jimenez on 11/26/20 at  1:00 PM EDT by a video enabled telemedicine application and verified that I am speaking with the correct person using two identifiers.  Location: Patient: Home Provider: Clinic   I discussed the limitations of evaluation and management by telemedicine and the availability of in person appointments. The patient expressed understanding and agreed to proceed.  I provided 30 minutes of non-face-to-face time during this encounter.      11/26/2020 1:31 PM Kenneth Jimenez  MRN:  [id number]  Chief Complaint: "I got my  medications"   HPI: 55 year old male seen today for follow up psychiatric evaluation.   He has a psychiatric history of bipolar disorder, PTSD, explosive disorder, depression, and cannabis use disorder.  Patient is currently prescribed  mirtazapine 15 mg at bedtime, Lamictal 50 mg, hydroxyzine 25 mg 3 times daily as needed, Paxil 30 mg daily, and Seroquel 400 HS. patient notes that he restarted his medications this Monday and notes that they are effective in managing his psychiatric conditions.  He notes that he has not been taking Lamictal.  Today patient is well-groomed, pleasant, cooperative, engaged in conversation, and maintained eye contact.  He informed provider that he restart his medications on Monday and notes that he is feeling better mentally.  Patient notes that he is still under a lot of stress and reports that at times he has increased anxiety and depression.  He informed Clinical research associate that her daughter is in the custody of social services.  He notes that he is in the process of getting custody of her and finding stable housing for the 2 of them.  He notes that recently his housing voucher was increased from $700 to 900 hours.   Patient notes that his anxiety and depression are exacerbated by the above stressors.  Today provider conducted a GAD-7 and patient scored a 20.   Provider also conducted a PHQ-9 and patient scored a 20.  Today he denies SI/HI/VAH or paranoia.  He notes that he sleeps approximately 3 to 4 hours nightly.  Patient also notes that his appetite is poor but reports that he has been gaining weight.  He notes that he gained 38 pounds over the last 6 months.  Provider informed patient that mirtazapine at times can cause weight gain however patient has not been taking mirtazapine because he was unable to afford it.   Patient is agreeable to increase Paxil 30 mg to 40 mg to help manage anxiety and depression.  At this time he notes that he does not want mirtazapine increased to help improve sleep.  At this time we will discontinue Lamictal as he has not been taking it.  He will continue all other medications as prescribed.  No other concerns noted at this time.       Visit Diagnosis:    ICD-10-CM   1. Moderate episode of recurrent major depressive disorder (HCC)  F33.1 hydrOXYzine (ATARAX/VISTARIL) 25 MG tablet    mirtazapine (REMERON) 15 MG tablet    PARoxetine (PAXIL) 40 MG tablet  2. Bipolar I disorder, most recent episode depressed (HCC)  F31.30 QUEtiapine (SEROQUEL) 400 MG tablet    Past Psychiatric History:  bipolar disorder, PTSD, explosive disorder, depression, and cannabis use disorder  Past Medical History:  Past Medical History:  Diagnosis Date  . Anorexia   . Atrial fib/flutter, transient   . Back pain   . Bipolar 1 disorder (HCC)   .  Bipolar disorder (HCC)   . Bradycardia   . Chest pain   . Coronary artery disease   . Hyperthyroidism   . Palpitations   . Seizures (HCC)   . Tachyarrhythmia    History reviewed. No pertinent surgical history.  Family Psychiatric History:  PTSD, Depression, Bipolar disorder,   Family History:  Family History  Problem Relation Age of Onset  . Heart disease Mother   . Diabetes Mother   . Heart failure Mother   . Heart disease Father   . Heart attack Father     Social History:  Social  History   Socioeconomic History  . Marital status: Married    Spouse name: Not on file  . Number of children: Not on file  . Years of education: Not on file  . Highest education level: Not on file  Occupational History  . Not on file  Tobacco Use  . Smoking status: Current Every Day Smoker    Packs/day: 0.25    Types: Cigarettes  . Smokeless tobacco: Current User  Substance and Sexual Activity  . Alcohol use: No  . Drug use: Yes    Types: Marijuana  . Sexual activity: Yes  Other Topics Concern  . Not on file  Social History Narrative   Currently working to get medicaid.    Social Determinants of Health   Financial Resource Strain: Not on file  Food Insecurity: Not on file  Transportation Needs: Not on file  Physical Activity: Not on file  Stress: Not on file  Social Connections: Not on file    Allergies:  Allergies  Allergen Reactions  . Penicillins Hives    Metabolic Disorder Labs: No results found for: HGBA1C, MPG No results found for: PROLACTIN No results found for: CHOL, TRIG, HDL, CHOLHDL, VLDL, LDLCALC Lab Results  Component Value Date   TSH 0.381 02/06/2013   TSH 0.376 03/21/2011    Therapeutic Level Labs: No results found for: LITHIUM Lab Results  Component Value Date   VALPROATE <10.0 (L) 12/22/2014   VALPROATE <10.0 (L) 09/11/2010   No components found for:  CBMZ  Current Medications: Current Outpatient Medications  Medication Sig Dispense Refill  . chlorhexidine (PERIDEX) 0.12 % solution Use as directed 15 mLs in the mouth or throat 2 (two) times daily. Swish and spit.  Do not swallow. (Patient not taking: Reported on 06/18/2020) 120 mL 0  . fluconazole (DIFLUCAN) 200 MG tablet Take 1 tablet (200 mg total) by mouth daily. (Patient not taking: Reported on 06/18/2020) 7 tablet 0  . hydrOXYzine (ATARAX/VISTARIL) 25 MG tablet Take 1 tablet (25 mg total) by mouth 3 (three) times daily as needed. 30 tablet 2  . mirtazapine (REMERON) 15 MG tablet  Take 1 tablet (15 mg total) by mouth at bedtime. 30 tablet 2  . PARoxetine (PAXIL) 40 MG tablet Take 1 tablet (40 mg total) by mouth daily. 30 tablet 2  . QUEtiapine (SEROQUEL) 400 MG tablet Take 1 tablet (400 mg total) by mouth at bedtime. 30 tablet 2   No current facility-administered medications for this visit.     Musculoskeletal: Strength & Muscle Tone: Unable to assess due to telephone visit Gait & Station: Unable to assess due to telephone visit Patient leans: N/A  Psychiatric Specialty Exam: Review of Systems  There were no vitals taken for this visit.There is no height or weight on file to calculate BMI.  General Appearance: Unable to assess due to telephone visit  Eye Contact:  Unable to assess  due to telephone visit  Speech:  Clear and Coherent and Normal Rate  Volume:  Normal  Mood:  Anxious and Depressed  Affect:  Appropriate and Congruent  Thought Process:  Coherent, Goal Directed and Linear  Orientation:  Full (Time, Place, and Person)  Thought Content: WDL and Logical   Suicidal Thoughts:  No  Homicidal Thoughts:  No  Memory:  Immediate;   Good Recent;   Good Remote;   Good  Judgement:  Good  Insight:  Good  Psychomotor Activity:  Normal  Concentration:  Concentration: Good and Attention Span: Good  Recall:  Good  Fund of Knowledge: Good  Language: Good  Akathisia:  No  Handed:  Left  AIMS (if indicated): Not done  Assets:  Communication Skills Desire for Improvement Financial Resources/Insurance Housing Intimacy Leisure Time Social Support  ADL's:  Intact  Cognition: WNL  Sleep:  Fair   Screenings: GAD-7   Flowsheet Row Video Visit from 11/26/2020 in [facility]  Total GAD-7 Score 20    PHQ2-9   Flowsheet Row Video Visit from 11/26/2020 in [facility] Counselor from 05/26/2020 in [facility]  PHQ-2 Total Score 6 2  PHQ-9 Total Score 20 5    Flowsheet Row  Video Visit from 11/26/2020 in [facility] ED from 06/17/2020 in [facility] EMERGENCY DEPARTMENT  C-SSRS RISK CATEGORY No Risk Error: Q3, 4, or 5 should not be populated when Q2 is No       Assessment and Plan: Patient endorses symptoms of anxiety,and depression since being out of his medications. Patient is agreeable to increase Paxil 30 mg to 40 mg to help manage anxiety and depression.  At this time he notes that he does not want mirtazapine increased to help improve sleep.  At this time we will discontinue Lamictal as he has not been taking it.  He will continue all other medications as prescribed.   1. Moderate episode of recurrent major depressive disorder (HCC)  Continue- mirtazapine (REMERON) 15 MG tablet; Take 1 tablet (15 mg total) by mouth at bedtime.  Dispense: 30 tablet; Refill: 2 Continue- PARoxetine (PAXIL) 40 MG tablet; Take 1 tablets (40 mg total) by mouth daily.  Dispense: 30 tablet; Refill: 2 Continue- hydrOXYzine (ATARAX/VISTARIL) 25 MG tablet; Take 1 tablet (25 mg total) by mouth 3 (three) times daily as needed.  Dispense: 30 tablet; Refill: 2  2. Bipolar I disorder, most recent episode depressed (HCC)  Continue- QUEtiapine (SEROQUEL) 400 MG tablet; Take 1 tablet (400 mg total) by mouth at bedtime.  Dispense: 30 tablet; Refill: 2    Follow up in 3 months Follow up with outpatient therapist.   Shanna Cisco, NP 11/26/2020, 1:31 PM

## 2020-12-11 ENCOUNTER — Telehealth: Payer: Self-pay

## 2020-12-11 NOTE — Telephone Encounter (Signed)
Faxed application to [facility] for processing.  Application was not complete, missing 4506-T tax request for non-filing letter.  Patient was informed that signature was needed on 4506-T form 11/25/20 via phone and he indicated he would be in to sign "in a couple days".  Form is still unsigned, went ahead and faxed what we had.

## 2020-12-30 ENCOUNTER — Other Ambulatory Visit: Payer: Self-pay

## 2020-12-30 ENCOUNTER — Telehealth: Payer: Self-pay
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
## 2020-12-30 NOTE — Telephone Encounter (Signed)
Patient was confrontational on phone call.  I called to remind patient that we were still waiting on him to sign additional paperwork for [place] [facility] enrollment.  Patient states that he is aware, has transportation issues.  States he needs a message sent to [employer] about needing his therapy session and wanted to know about medication refills.  Advised that we can refill his meds, but he has utilized his one-year-free fill so there would be a copay for his meds $4 or $10 each if not offered thru [facility] ($0) stock.  Stated "What would happen if I had a mental break and came in and killed everyone in the clinic (referring to [facility]/clinic).  Advised that these comments are not acceptable and that if he made threatening comments again he would not be able to fill medications at [facility].  Patient stated that [place] told him that it would be $4 or $10 total for all meds.  Patient feels that all his meds should be free of charge because he is homeless and has no income, explained to patient that this is why we are trying to get him enrolled with [place] [facility].  We plan to temporarily charge to an account if needed in the future to avoid patient's hostile nature.  The account will be charged, but if consistent minimum payments are not made monthly the charge account will be deactivated and charging will no longer be an option.

## 2021-01-11 ENCOUNTER — Telehealth: Payer: Self-pay

## 2021-01-11 NOTE — Telephone Encounter (Signed)
Received 2 forms today from [facility].  Previously had requested that patient complete these 2 forms on 12/11/2020 and 12/30/2020.  Patient has not completed forms as of today 01/11/2021.  The 2 forms that [place] [facility] sent to me for patient completion are the Homelessness attestation form and non-filing attestation for 2021.  [place] [facility] needs these 2 forms to complete application processing for free medication.

## 2021-01-13 ENCOUNTER — Ambulatory Visit (INDEPENDENT_AMBULATORY_CARE_PROVIDER_SITE_OTHER): Payer: No Payment, Other | Admitting: Clinical

## 2021-01-13 ENCOUNTER — Other Ambulatory Visit: Payer: Self-pay

## 2021-01-13 DIAGNOSIS — F3130 Bipolar disorder, current episode depressed, mild or moderate severity, unspecified: Secondary | ICD-10-CM
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
## 2021-02-17 ENCOUNTER — Ambulatory Visit (INDEPENDENT_AMBULATORY_CARE_PROVIDER_SITE_OTHER): Payer: No Payment, Other | Admitting: Clinical

## 2021-02-17 ENCOUNTER — Other Ambulatory Visit: Payer: Self-pay

## 2021-02-17 ENCOUNTER — Telehealth (HOSPITAL_COMMUNITY): Payer: Self-pay | Admitting: Clinical

## 2021-02-17 DIAGNOSIS — F3130 Bipolar disorder, current episode depressed, mild or moderate severity, unspecified: Secondary | ICD-10-CM

## 2021-02-17 NOTE — Progress Notes (Signed)
   THERAPIST PROGRESS NOTE Virtual Visit via Telephone Note  I connected with Kenneth Jimenez on 02/17/21 at  3:00 PM EDT by telephone and verified that I am speaking with the correct person using two identifiers.  Location: Patient: home Provider: office   I discussed the limitations, risks, security and privacy concerns of performing an evaluation and management service by telephone and the availability of in person appointments. I also discussed with the patient that there may be a patient responsible charge related to this service. The patient expressed understanding and agreed to proceed.   Follow Up Instructions: I discussed the assessment and treatment plan with the patient. The patient was provided an opportunity to ask questions and all were answered. The patient agreed with the plan and demonstrated an understanding of the instructions.   The patient was advised to call back or seek an in-person evaluation if the symptoms worsen or if the condition fails to improve as anticipated.   Session Time: 35 minutes  Participation Level: Active  Behavioral Response: NAAlertEuthymic  Type of Therapy: Individual Therapy  Treatment Goals addressed: Coping  Interventions: CBT and Supportive  Summary:  Kenneth Jimenez is a 55 y.o. male who presents for the scheduled session oriented times five and friendly. Client denied hallucinations and delusions. Client reported on today that he is doing well. Client reported he just moved into his new apartment. Client reported he is waiting to get settled in after his furniture gets delivered. Client reported he is also looking forward to having his daughter come stay with him. 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 Client discussed enjoying his visitation with his daughter and children. Client reported one his main triggers currently has been his communication with social services. Client reported he has  spoken with the social services supervisors about his concerns with certain workers that are provoking him to act out of character and has no plan to say or harm anyone. Client reported he has been doing well with ignoring the impulse to react but worries one day he may lash out. Client reported he feels threatened by them because of problems he has had in the past with the department. Client reported he would like to speak with his [facility] psychiatrist about coordinating conversation between them so she can explain to them the best way to improve communication with him. Client reported otherwise he is doing well and attending parenting and life classes ordered by the court. Client reported he would like to also speak with the doctor more about understanding his medication and what they treat.    Suicidal/Homicidal: Nowithout intent/plan  Therapist Response:  Therapist began the session checking in and asking how he has been doing since last seen. Therapist actively listening, use eye contact, and positive support while the client discussed his thoughts and feelings.  Therapist used CBT to discuss impulse control. Therapist used CBT to engage with the client to discuss consequences of reacting to emotions without thinking first. Therapist assigned the client homework to practice challenge negative thinking. Client was scheduled for next appointment.     Plan: Return again in 4 weeks.  Diagnosis: Bipolar 1 disorder, mot recent episode depressed    Neena Rhymes Danitza Schoenfeldt, LCSW 02/17/2021

## 2021-02-18 ENCOUNTER — Encounter (HOSPITAL_COMMUNITY): Payer: Self-pay

## 2021-02-18 NOTE — Telephone Encounter (Signed)
Provider called patient who requested that provider send him a list of his medications and their uses.  Provider honored this request and email patient a list of his medications and what they are for.  Patient informed provider that overall he is doing well.  He notes that recently he moved into a new home and feels extremely blessed.  He notes that he is still attempting to gain custody of his daughter.  He will follow-up with provider on 02/22/2021.  No other concerns at this time.

## 2021-02-22 ENCOUNTER — Encounter (HOSPITAL_COMMUNITY): Payer: Self-pay | Admitting: Psychiatry

## 2021-02-22 ENCOUNTER — Telehealth (INDEPENDENT_AMBULATORY_CARE_PROVIDER_SITE_OTHER): Payer: No Payment, Other | Admitting: Psychiatry

## 2021-02-22 ENCOUNTER — Other Ambulatory Visit: Payer: Self-pay

## 2021-02-22 DIAGNOSIS — F3130 Bipolar disorder, current episode depressed, mild or moderate severity, unspecified: Secondary | ICD-10-CM

## 2021-02-22 DIAGNOSIS — F331 Major depressive disorder, recurrent, moderate: Secondary | ICD-10-CM

## 2021-02-22 MED ORDER — HYDROXYZINE HCL 25 MG PO TABS
ORAL_TABLET | Freq: Three times a day (TID) | ORAL | 2 refills | Status: DC | PRN
  Filled 2021-02-22: qty 30, 10d supply, fill #0

## 2021-02-22 MED ORDER — QUETIAPINE FUMARATE 400 MG PO TABS
ORAL_TABLET | ORAL | 2 refills | Status: DC
  Filled 2021-02-22: qty 30, 30d supply, fill #0

## 2021-02-22 MED ORDER — MIRTAZAPINE 15 MG PO TABS
ORAL_TABLET | Freq: Every day | ORAL | 2 refills | Status: DC
  Filled 2021-02-22: qty 30, 30d supply, fill #0

## 2021-02-22 MED ORDER — PAROXETINE HCL 40 MG PO TABS
ORAL_TABLET | Freq: Every day | ORAL | 2 refills | Status: DC
  Filled 2021-02-22: qty 30, 30d supply, fill #0

## 2021-02-22 NOTE — Progress Notes (Signed)
BH MD/PA/NP OP Progress Note Virtual Visit via Video Note  I connected with Kenneth Jimenez on 02/22/21 at  1:00 PM EDT by a video enabled telemedicine application and verified that I am speaking with the correct person using two identifiers.  Location: Patient: Home Provider: Clinic   I discussed the limitations of evaluation and management by telemedicine and the availability of in person appointments. The patient expressed understanding and agreed to proceed.  I provided 30 minutes of non-face-to-face time during this encounter.      02/22/2021 1:28 PM Kenneth Jimenez  MRN:  [id number]  Chief Complaint: "Certain things trigger me"   HPI: 55 year old male seen today for follow up psychiatric evaluation.   He has a psychiatric history of bipolar disorder, PTSD, explosive disorder, depression, and cannabis use disorder.  Patient is currently prescribed  mirtazapine 15 mg at bedtime,  hydroxyzine 25 mg 3 times daily as needed, Paxil 40 mg daily, and Seroquel 400 HS. Patient notes that his medications are effective in managing his psychiatric conditions.    Today patient unable to logon virtually so assessment was done over the phone.  During exam he was pleasant, cooperative, and engaged in conversation. He informed provider that since starting his medication his mood, anxiety, and depression has improved. He notes that he is no longer is homeless and living in his new home which makes him feel more secure.  Patient did Archivist however that certain things trigger him.  He notes that his daughter is currently in the custody of DSS.  He notes that he is doing everything they request to regain custody of her however notes that they treat him as if he has no parental rights.  He informed Clinical research associate that he is mindful and using his coping mechanisms from therapy to cope with the stressors.  Today he denies SI/HI/VH, mania, or paranoia.  He notes that his sleep is adequate when he is not stressed  however reports that for the last few weeks he has been sleeping 4 hours nightly.  He endorses having an adequate appetite.  Today provider conducted a GAD-7 and patient scored a 20, at his last visit he scored a 15.  Provider also conducted a PHQ-9 and patient scored a 12, at his last visit he scored a 20.    No medication changes made today.  Patient agreeable to continue medication as prescribed.  He will follow-up with outpatient counseling for therapy.  No other concerns noted at this time.       Visit Diagnosis:    ICD-10-CM   1. Moderate episode of recurrent major depressive disorder (HCC)  F33.1 hydrOXYzine (ATARAX/VISTARIL) 25 MG tablet    mirtazapine (REMERON) 15 MG tablet    PARoxetine (PAXIL) 40 MG tablet    2. Bipolar I disorder, most recent episode depressed (HCC)  F31.30 QUEtiapine (SEROQUEL) 400 MG tablet      Past Psychiatric History:  bipolar disorder, PTSD, explosive disorder, depression, and cannabis use disorder  Past Medical History:  Past Medical History:  Diagnosis Date   Anorexia    Atrial fib/flutter, transient    Back pain    Bipolar 1 disorder (HCC)    Bipolar disorder (HCC)    Bradycardia    Chest pain    Coronary artery disease    Hyperthyroidism    Palpitations    Seizures (HCC)    Tachyarrhythmia    No past surgical history on file.  Family Psychiatric History:  PTSD, Depression, Bipolar disorder,  Family History:  Family History  Problem Relation Age of Onset   Heart disease Mother    Diabetes Mother    Heart failure Mother    Heart disease Father    Heart attack Father     Social History:  Social History   Socioeconomic History   Marital status: Married    Spouse name: Not on file   Number of children: Not on file   Years of education: Not on file   Highest education level: Not on file  Occupational History   Not on file  Tobacco Use   Smoking status: Every Day    Packs/day: 0.25    Pack years: 0.00    Types:  Cigarettes   Smokeless tobacco: Current  Substance and Sexual Activity   Alcohol use: No   Drug use: Yes    Types: Marijuana   Sexual activity: Yes  Other Topics Concern   Not on file  Social History Narrative   Currently working to get medicaid.    Social Determinants of Health   Financial Resource Strain: Not on file  Food Insecurity: Not on file  Transportation Needs: Not on file  Physical Activity: Not on file  Stress: Not on file  Social Connections: Not on file    Allergies:  Allergies  Allergen Reactions   Penicillins Hives    Metabolic Disorder Labs: No results found for: HGBA1C, MPG No results found for: PROLACTIN No results found for: CHOL, TRIG, HDL, CHOLHDL, VLDL, LDLCALC Lab Results  Component Value Date   TSH 0.381 02/06/2013   TSH 0.376 03/21/2011    Therapeutic Level Labs: No results found for: LITHIUM Lab Results  Component Value Date   VALPROATE <10.0 (L) 12/22/2014   VALPROATE <10.0 (L) 09/11/2010   No components found for:  CBMZ  Current Medications: Current Outpatient Medications  Medication Sig Dispense Refill   chlorhexidine (PERIDEX) 0.12 % solution Use as directed 15 mLs in the mouth or throat 2 (two) times daily. Swish and spit.  Do not swallow. (Patient not taking: Reported on 06/18/2020) 120 mL 0   fluconazole (DIFLUCAN) 200 MG tablet Take 1 tablet (200 mg total) by mouth daily. (Patient not taking: Reported on 06/18/2020) 7 tablet 0   hydrOXYzine (ATARAX/VISTARIL) 25 MG tablet TAKE 1 TABLET (25 MG TOTAL) BY MOUTH 3 (THREE) TIMES DAILY AS NEEDED. 30 tablet 2   mirtazapine (REMERON) 15 MG tablet TAKE 1 TABLET (15 MG TOTAL) BY MOUTH AT BEDTIME. 30 tablet 2   PARoxetine (PAXIL) 40 MG tablet TAKE 1 TABLET (40 MG TOTAL) BY MOUTH DAILY. 30 tablet 2   QUEtiapine (SEROQUEL) 400 MG tablet TAKE 1 TABLET (400 MG TOTAL) BY MOUTH AT BEDTIME. 30 tablet 2   No current facility-administered medications for this visit.      Musculoskeletal: Strength & Muscle Tone:  Unable to assess due to telephone visit Gait & Station:  Unable to assess due to telephone visit Patient leans: N/A  Psychiatric Specialty Exam: Review of Systems  There were no vitals taken for this visit.There is no height or weight on file to calculate BMI.  General Appearance:  Unable to assess due to telephone visit  Eye Contact:   Unable to assess due to telephone visit  Speech:  Clear and Coherent and Normal Rate  Volume:  Normal  Mood:  Anxious and Depressed  Affect:  Appropriate and Congruent  Thought Process:  Coherent, Goal Directed and Linear  Orientation:  Full (Time, Place, and Person)  Thought  Content: WDL and Logical   Suicidal Thoughts:  No  Homicidal Thoughts:  No  Memory:  Immediate;   Good Recent;   Good Remote;   Good  Judgement:  Good  Insight:  Good  Psychomotor Activity:  Normal  Concentration:  Concentration: Good and Attention Span: Good  Recall:  Good  Fund of Knowledge: Good  Language: Good  Akathisia:  No  Handed:  Left  AIMS (if indicated): Not done  Assets:  Communication Skills Desire for Improvement Financial Resources/Insurance Housing Intimacy Leisure Time Social Support  ADL's:  Intact  Cognition: WNL  Sleep:  Fair   Screenings: GAD-7    Flowsheet Row Video Visit from 02/22/2021 in [facility] Video Visit from 11/26/2020 in [facility]  Total GAD-7 Score 15 20      PHQ2-9    Flowsheet Row Video Visit from 02/22/2021 in [facility] Video Visit from 11/26/2020 in [facility] Counselor from 05/26/2020 in [facility]  PHQ-2 Total Score 4 6 2   PHQ-9 Total Score 12 20 5       Flowsheet Row Video Visit from 11/26/2020 in [facility] ED from 06/17/2020 in [facility] EMERGENCY DEPARTMENT  C-SSRS RISK  CATEGORY No Risk Error: Q3, 4, or 5 should not be populated when Q2 is No        Assessment and Plan: Patient notes that his anxiety, depression, and mood has improved since his last visit.  No medication changes made today.  Patient agreeable to continue medication as prescribed.  1. Moderate episode of recurrent major depressive disorder (HCC)  Continue- mirtazapine (REMERON) 15 MG tablet; Take 1 tablet (15 mg total) by mouth at bedtime.  Dispense: 30 tablet; Refill: 2 Continue- PARoxetine (PAXIL) 40 MG tablet; Take 1 tablets (40 mg total) by mouth daily.  Dispense: 30 tablet; Refill: 2 Continue- hydrOXYzine (ATARAX/VISTARIL) 25 MG tablet; Take 1 tablet (25 mg total) by mouth 3 (three) times daily as needed.  Dispense: 30 tablet; Refill: 2  2. Bipolar I disorder, most recent episode depressed (HCC)  Continue- QUEtiapine (SEROQUEL) 400 MG tablet; Take 1 tablet (400 mg total) by mouth at bedtime.  Dispense: 30 tablet; Refill: 2    Follow up in 3 months Follow up with outpatient therapist.   08/17/2020, NP 02/22/2021, 1:28 PM

## 2021-02-26 ENCOUNTER — Other Ambulatory Visit: Payer: Self-pay

## 2021-03-01 ENCOUNTER — Other Ambulatory Visit: Payer: Self-pay

## 2021-03-04 ENCOUNTER — Other Ambulatory Visit: Payer: Self-pay

## 2021-03-09 ENCOUNTER — Ambulatory Visit (HOSPITAL_COMMUNITY): Payer: No Payment, Other | Admitting: Clinical

## 2021-03-26 ENCOUNTER — Ambulatory Visit (HOSPITAL_COMMUNITY): Payer: No Payment, Other | Admitting: Clinical

## 2021-04-14 ENCOUNTER — Ambulatory Visit (INDEPENDENT_AMBULATORY_CARE_PROVIDER_SITE_OTHER): Payer: No Payment, Other | Admitting: Clinical

## 2021-04-14 ENCOUNTER — Other Ambulatory Visit: Payer: Self-pay

## 2021-04-14 DIAGNOSIS — F3130 Bipolar disorder, current episode depressed, mild or moderate severity, unspecified: Secondary | ICD-10-CM

## 2021-04-17 NOTE — Progress Notes (Signed)
   THERAPIST PROGRESS NOTE Virtual Visit via Telephone Note  I connected with [name] on 04/14/2021 at  2:00 PM EDT by telephone and verified that I am speaking with the correct person using two identifiers.  Location: Patient: home Provider: office   I discussed the limitations, risks, security and privacy concerns of performing an evaluation and management service by telephone and the availability of in person appointments. I also discussed with the patient that there may be a patient responsible charge related to this service. The patient expressed understanding and agreed to proceed.   Follow Up Instructions: I discussed the assessment and treatment plan with the patient. The patient was provided an opportunity to ask questions and all were answered. The patient agreed with the plan and demonstrated an understanding of the instructions.   The patient was advised to call back or seek an in-person evaluation if the symptoms worsen or if the condition fails to improve as anticipated.  Session Time: 25 minutes  Participation Level: Active  Behavioral Response: CasualAlertEuthymic  Type of Therapy: Individual Therapy  Treatment Goals addressed: Coping  Interventions: CBT and Supportive  Summary:  Kenneth Jimenez is a 55 y.o. male who presents for the scheduled session oriented x5 and friendly.  Client denied hallucinations and delusions. Client reported on today he is doing well.  Client reported he has been doing different side jobs to put money in his pocket.  Client reported otherwise he has court on the 17th regarding the custody of his daughter.  Client reported his daughter is doing better and will be placed in a temporary home until the court date.  Client reported social services came out to his house and saw that he had a suitable living environment so the goal is for one of his twin daughters who live with him but eventually he would like to get custody of the other as  well.  Client reported he also has the goal of attempting to quit cigarettes again.  Client reported he still has concerns about memory loss.  Client reported he would like to know some options for testing regarding that.  Client reported his current medication regimen is working well for him but he has some reservations that it may be affecting his memory loss.  Client reported he is eating and sleeping well.    Suicidal/Homicidal: Nowithout intent/plan  Therapist Response:  Therapist began the session asking the client how he has been doing since last spoken with. Therapist used active listening and positive emotional support towards his thoughts and feelings. Therapist used CBT to engage with the client and ask about updates on stressors that may be affecting his mental health symptoms. Therapist used CBT to ask the client about his medication compliance compared to his current symptoms. Therapist used CBT to engage with the client to identify positive progress that has been made. Therapist addressed questions and concerns. Client was scheduled for next appointment.    Plan: Return again in 5 weeks for therapy.  Therapist will speak with current psychiatrist about possible options for referral for memory testing.  Therapist will speak with the client about the recommendations at the next appointment.  Diagnosis: Bipolar 1 disorder, most recent episode depressed    Kenneth Jimenez Kenneth Wunder, LCSW 04/14/2021

## 2021-05-04 ENCOUNTER — Ambulatory Visit (INDEPENDENT_AMBULATORY_CARE_PROVIDER_SITE_OTHER): Payer: No Payment, Other | Admitting: Clinical

## 2021-05-04 ENCOUNTER — Other Ambulatory Visit: Payer: Self-pay

## 2021-05-04 DIAGNOSIS — F3130 Bipolar disorder, current episode depressed, mild or moderate severity, unspecified: Secondary | ICD-10-CM
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
## 2021-05-24 ENCOUNTER — Telehealth (INDEPENDENT_AMBULATORY_CARE_PROVIDER_SITE_OTHER): Payer: No Payment, Other | Admitting: Psychiatry

## 2021-05-24 ENCOUNTER — Encounter (HOSPITAL_COMMUNITY): Payer: Self-pay | Admitting: Psychiatry

## 2021-05-24 ENCOUNTER — Other Ambulatory Visit: Payer: Self-pay

## 2021-05-24 DIAGNOSIS — F3130 Bipolar disorder, current episode depressed, mild or moderate severity, unspecified: Secondary | ICD-10-CM

## 2021-05-24 DIAGNOSIS — F331 Major depressive disorder, recurrent, moderate: Secondary | ICD-10-CM | POA: Diagnosis not present

## 2021-05-24 MED ORDER — HYDROXYZINE HCL 25 MG PO TABS
ORAL_TABLET | Freq: Three times a day (TID) | ORAL | 3 refills | Status: DC | PRN
  Filled 2021-05-24: qty 90, 30d supply, fill #0

## 2021-05-24 MED ORDER — QUETIAPINE FUMARATE 400 MG PO TABS
ORAL_TABLET | ORAL | 3 refills | Status: DC
  Filled 2021-05-24: qty 30, 30d supply, fill #0

## 2021-05-24 MED ORDER — MIRTAZAPINE 15 MG PO TABS
ORAL_TABLET | Freq: Every day | ORAL | 3 refills | Status: DC
  Filled 2021-05-24: qty 30, 30d supply, fill #0

## 2021-05-24 MED ORDER — PAROXETINE HCL 40 MG PO TABS
ORAL_TABLET | Freq: Every day | ORAL | 3 refills | Status: DC
  Filled 2021-05-24: qty 30, 30d supply, fill #0

## 2021-05-24 NOTE — Progress Notes (Signed)
BH MD/PA/NP OP Progress Note Virtual Visit via Telephone Note  I connected with Kenneth Jimenez on 05/24/21 at  1:00 PM EDT by telephone and verified that I am speaking with the correct person using two identifiers.  Location: Patient: home Provider: Clinic   I discussed the limitations, risks, security and privacy concerns of performing an evaluation and management service by telephone and the availability of in person appointments. I also discussed with the patient that there may be a patient responsible charge related to this service. The patient expressed understanding and agreed to proceed.   I provided 30 minutes of non-face-to-face time during this encounter.      05/24/2021 1:06 PM Kenneth Jimenez  MRN:  [id number]  Chief Complaint: "My meds are doing okay"   HPI: 55 year old male seen today for follow up psychiatric evaluation.   He has a psychiatric history of bipolar disorder, PTSD, explosive disorder, depression, and cannabis use disorder.  Patient is currently prescribed  mirtazapine 15 mg at bedtime,  hydroxyzine 25 mg 3 times daily as needed, Paxil 40 mg daily, and Seroquel 400 HS. Patient notes that his medications are effective in managing his psychiatric conditions.    Today patient unable to logon virtually so assessment was done over the phone.  During exam he was pleasant, cooperative, and engaged in conversation. He informed provider that since his last visit his mood has been stable and notes that he has minimal anxiety and depression. He informed provider that he has been using positive coping skills to recognize when he needs to calm down. He endorses adequate sleep and appetite. Today he denies SI/VAH, mania or   paranoia.   atient notes while at family court for his daughter he found out that he had a warrant for his arrest and was placed in jail for 6 days. He informed Clinical research associate that his ex girlfriend took him to count and he reports that he was not informed.  He  notes that in the past this type of setback would have made him really angry however he reports that he was able to cope with it.  He informed Clinical research associate he is still fighting for custody of his daughter and has a follow-up appointment this month to gain custody.    No medication changes made today.  Patient agreeable to continue medication as prescribed.  He will follow-up with outpatient counseling for therapy.  No other concerns noted at this time.       Visit Diagnosis:    ICD-10-CM   1. Moderate episode of recurrent major depressive disorder (HCC)  F33.1 hydrOXYzine (ATARAX/VISTARIL) 25 MG tablet    mirtazapine (REMERON) 15 MG tablet    PARoxetine (PAXIL) 40 MG tablet    2. Bipolar I disorder, most recent episode depressed (HCC)  F31.30 QUEtiapine (SEROQUEL) 400 MG tablet      Past Psychiatric History:  bipolar disorder, PTSD, explosive disorder, depression, and cannabis use disorder  Past Medical History:  Past Medical History:  Diagnosis Date   Anorexia    Atrial fib/flutter, transient    Back pain    Bipolar 1 disorder (HCC)    Bipolar disorder (HCC)    Bradycardia    Chest pain    Coronary artery disease    Hyperthyroidism    Palpitations    Seizures (HCC)    Tachyarrhythmia    No past surgical history on file.  Family Psychiatric History:  PTSD, Depression, Bipolar disorder,   Family History:  Family History  Problem Relation  Age of Onset   Heart disease Mother    Diabetes Mother    Heart failure Mother    Heart disease Father    Heart attack Father     Social History:  Social History   Socioeconomic History   Marital status: Married    Spouse name: Not on file   Number of children: Not on file   Years of education: Not on file   Highest education level: Not on file  Occupational History   Not on file  Tobacco Use   Smoking status: Every Day    Packs/day: 0.25    Types: Cigarettes   Smokeless tobacco: Current  Substance and Sexual Activity    Alcohol use: No   Drug use: Yes    Types: Marijuana   Sexual activity: Yes  Other Topics Concern   Not on file  Social History Narrative   Currently working to get medicaid.    Social Determinants of Health   Financial Resource Strain: Not on file  Food Insecurity: Not on file  Transportation Needs: Not on file  Physical Activity: Not on file  Stress: Not on file  Social Connections: Not on file    Allergies:  Allergies  Allergen Reactions   Penicillins Hives    Metabolic Disorder Labs: No results found for: HGBA1C, MPG No results found for: PROLACTIN No results found for: CHOL, TRIG, HDL, CHOLHDL, VLDL, LDLCALC Lab Results  Component Value Date   TSH 0.381 02/06/2013   TSH 0.376 03/21/2011    Therapeutic Level Labs: No results found for: LITHIUM Lab Results  Component Value Date   VALPROATE <10.0 (L) 12/22/2014   VALPROATE <10.0 (L) 09/11/2010   No components found for:  CBMZ  Current Medications: Current Outpatient Medications  Medication Sig Dispense Refill   chlorhexidine (PERIDEX) 0.12 % solution Use as directed 15 mLs in the mouth or throat 2 (two) times daily. Swish and spit.  Do not swallow. (Patient not taking: Reported on 06/18/2020) 120 mL 0   fluconazole (DIFLUCAN) 200 MG tablet Take 1 tablet (200 mg total) by mouth daily. (Patient not taking: Reported on 06/18/2020) 7 tablet 0   hydrOXYzine (ATARAX/VISTARIL) 25 MG tablet TAKE 1 TABLET (25 MG TOTAL) BY MOUTH 3 (THREE) TIMES DAILY AS NEEDED. 90 tablet 3   mirtazapine (REMERON) 15 MG tablet TAKE 1 TABLET (15 MG TOTAL) BY MOUTH AT BEDTIME. 30 tablet 3   PARoxetine (PAXIL) 40 MG tablet TAKE 1 TABLET (40 MG TOTAL) BY MOUTH DAILY. 30 tablet 3   QUEtiapine (SEROQUEL) 400 MG tablet TAKE 1 TABLET (400 MG TOTAL) BY MOUTH AT BEDTIME. 30 tablet 3   No current facility-administered medications for this visit.     Musculoskeletal: Strength & Muscle Tone:  Unable to assess due to telephone visit Gait & Station:   Unable to assess due to telephone visit Patient leans: N/A  Psychiatric Specialty Exam: Review of Systems  There were no vitals taken for this visit.There is no height or weight on file to calculate BMI.  General Appearance:  Unable to assess due to telephone visit  Eye Contact:   Unable to assess due to telephone visit  Speech:  Clear and Coherent and Normal Rate  Volume:  Normal  Mood:  Euthymic  Affect:  Appropriate and Congruent  Thought Process:  Coherent, Goal Directed and Linear  Orientation:  Full (Time, Place, and Person)  Thought Content: WDL and Logical   Suicidal Thoughts:  No  Homicidal Thoughts:  No  Memory:  Immediate;   Good Recent;   Good Remote;   Good  Judgement:  Good  Insight:  Good  Psychomotor Activity:  Normal  Concentration:  Concentration: Good and Attention Span: Good  Recall:  Good  Fund of Knowledge: Good  Language: Good  Akathisia:  No  Handed:  Left  AIMS (if indicated): Not done  Assets:  Communication Skills Desire for Improvement Financial Resources/Insurance Housing Intimacy Leisure Time Social Support  ADL's:  Intact  Cognition: WNL  Sleep:  Good   Screenings: GAD-7    Flowsheet Row Video Visit from 02/22/2021 in [facility] Video Visit from 11/26/2020 in [facility]  Total GAD-7 Score 15 20      PHQ2-9    Flowsheet Row Video Visit from 02/22/2021 in [facility] Video Visit from 11/26/2020 in [facility] Counselor from 05/26/2020 in [facility]  PHQ-2 Total Score 4 6 2   PHQ-9 Total Score 12 20 5       Flowsheet Row Video Visit from 11/26/2020 in [facility] ED from 06/17/2020 in [facility] EMERGENCY DEPARTMENT  C-SSRS RISK CATEGORY No Risk Error: Q3, 4, or 5 should not be populated when Q2 is No        Assessment and Plan: Patient notes  that his anxiety, depression, and mood has improved since his last visit.  No medication changes made today.  Patient agreeable to continue medication as prescribed.  1. Moderate episode of recurrent major depressive disorder (HCC)  Continue- mirtazapine (REMERON) 15 MG tablet; Take 1 tablet (15 mg total) by mouth at bedtime.  Dispense: 30 tablet; Refill: 3 Continue- PARoxetine (PAXIL) 40 MG tablet; Take 1 tablets (40 mg total) by mouth daily.  Dispense: 30 tablet; Refill: 3 Continue- hydrOXYzine (ATARAX/VISTARIL) 25 MG tablet; Take 1 tablet (25 mg total) by mouth 3 (three) times daily as needed.  Dispense: 30 tablet; Refill: 3  2. Bipolar I disorder, most recent episode depressed (HCC)  Continue- QUEtiapine (SEROQUEL) 400 MG tablet; Take 1 tablet (400 mg total) by mouth at bedtime.  Dispense: 30 tablet; Refill: 3    Follow up in 3 months Follow up with outpatient therapist.   08/17/2020, NP 05/24/2021, 1:06 PM

## 2021-05-31 ENCOUNTER — Other Ambulatory Visit: Payer: Self-pay

## 2021-07-02 ENCOUNTER — Other Ambulatory Visit: Payer: Self-pay

## 2021-07-02 ENCOUNTER — Ambulatory Visit (INDEPENDENT_AMBULATORY_CARE_PROVIDER_SITE_OTHER): Payer: No Payment, Other | Admitting: Clinical

## 2021-07-02 DIAGNOSIS — F3130 Bipolar disorder, current episode depressed, mild or moderate severity, unspecified: Secondary | ICD-10-CM
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
## 2021-07-07 ENCOUNTER — Other Ambulatory Visit (HOSPITAL_COMMUNITY): Payer: Self-pay | Admitting: Psychiatry

## 2021-07-07 ENCOUNTER — Other Ambulatory Visit: Payer: Self-pay

## 2021-07-07 ENCOUNTER — Telehealth (HOSPITAL_COMMUNITY): Payer: Self-pay | Admitting: *Deleted

## 2021-07-07 ENCOUNTER — Telehealth (HOSPITAL_COMMUNITY): Payer: Self-pay | Admitting: Psychiatry

## 2021-07-07 DIAGNOSIS — F331 Major depressive disorder, recurrent, moderate: Secondary | ICD-10-CM

## 2021-07-07 MED ORDER — HYDROXYZINE HCL 50 MG PO TABS
50.0000 mg | ORAL_TABLET | Freq: Three times a day (TID) | ORAL | 3 refills | Status: DC | PRN
  Filled 2021-07-07: qty 90, 30d supply, fill #0

## 2021-07-07 NOTE — Telephone Encounter (Signed)
Message left on writers voice mail stating he has called all day and how can he get help if we dont answer the phone. I have been out of the office and had phone on forward, not here to answer it but someone should have. He states he needs to see his provider for his crying and wants an appt with Dr Doyne Keel. I called him back this am at [phone] but no ability on his phone to leave a message. He has a scheduled appt on 12/15 will offer him a walk in next week which is Dr Doyne Keel first available walk in time.

## 2021-07-07 NOTE — Telephone Encounter (Signed)
Patient stated that he has been having crying spells lately and this has been ongoing for about a week. States that he has called and left voicemails, but Clinical research associate has not received any. Patient requested for provider to call him and so they can discuss his issues and adjust medications if needed.

## 2021-07-07 NOTE — Telephone Encounter (Signed)
Patient notes that he is overwhelmed with life stressors. He notes that he is separated from his wife, his daughter is in foster care, he and his sister are odds, another daughter has echolalia which she reports overwhelmed him yesterday, and he and his new girlfriend recently had a confrontation and he called her a derogatory name.  Patient informed writer that he has been crying more lately because he has been stressed.  He notes he finds hydroxyzine effective in managing his stress levels.  Today he is agreeable to increasing hydroxyzine 25 mg 3 times daily to 50 mg 3 times daily as needed.  He will continue other medications as prescribed. 
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
## 2021-07-14 ENCOUNTER — Other Ambulatory Visit: Payer: Self-pay

## 2021-07-29 ENCOUNTER — Telehealth (HOSPITAL_COMMUNITY): Payer: Self-pay | Admitting: Psychiatry

## 2021-07-29 NOTE — Telephone Encounter (Signed)
Patient called for Dr. Kathlene November, with Clarisse Gouge, his friend on the phone as well.  Patient is requesting return call to speak to both of them. Pt reports provider has spoken to [place] several times previously & should have no problem speaking to them together.  Patient ph [phone]

## 2021-07-29 NOTE — Telephone Encounter (Signed)
Patient informed Clinical research associate that he is overwhelmed, depressed, and anxious.  He notes that he has been more irritable and disrespectful to his girlfriend Kenneth Jimenez.  Provider spoke to Ms. Kenneth Jimenez who reports that Kenneth Jimenez has been verbally aggressive to her on several occasions.  She notes that the final straw was when he became aggressive in front of her child.  She notes that she no longer wants to be a part of the relationship however this is not something that he wants.  Provider asked patient if he is consistently taking his medications and he notes that he takes them maybe 3 times a week.  Provider informed patient that compliance may help his mood, sleep (noting that it is poor), and anxiety.  He endorsed understanding and agreed.  At this time no medication changes made.  Patient notes that he will begin to taking his medications as prescribed.

## 2021-08-18 ENCOUNTER — Ambulatory Visit (INDEPENDENT_AMBULATORY_CARE_PROVIDER_SITE_OTHER): Payer: No Payment, Other | Admitting: Clinical

## 2021-08-18 DIAGNOSIS — F3130 Bipolar disorder, current episode depressed, mild or moderate severity, unspecified: Secondary | ICD-10-CM

## 2021-08-21 NOTE — Progress Notes (Signed)
   THERAPIST PROGRESS NOTE Virtual Visit via Telephone Note  I connected with Kenneth Jimenez on 08/18/2021 at  2:00 PM EST by telephone and verified that I am speaking with the correct person using two identifiers.  Location: Patient: home Provider: office   I discussed the limitations, risks, security and privacy concerns of performing an evaluation and management service by telephone and the availability of in person appointments. I also discussed with the patient that there may be a patient responsible charge related to this service. The patient expressed understanding and agreed to proceed.   Follow Up Instructions: I discussed the assessment and treatment plan with the patient. The patient was provided an opportunity to ask questions and all were answered. The patient agreed with the plan and demonstrated an understanding of the instructions.   The patient was advised to call back or seek an in-person evaluation if the symptoms worsen or if the condition fails to improve as anticipated.   Session Time: 30 minutes  Participation Level: Active  Behavioral Response: CasualAlertEuthymic  Type of Therapy: Individual Therapy  Treatment Goals addressed: Coping  Interventions: CBT and Supportive  Summary:  Kenneth Jimenez is a 55 y.o. male who presents for the session oriented times five, appropriately dressed, and friendly. Client denied hallucinations and delusions. Client reported he has encountered some challenges since the last session. Client reported he and his girlfriend have separated. Client reported they had an argument about trust between them. Client reported he listened to negative comments from the family about being able to trust her. Client reported he has blocked those people now and focusing on mending his relationship. Client reported he has been told that his tone of voice is harsh and others don't receive it well. Client reported he does not mean to be rude but that  is naturally how he has talked since he was younger. Client reported he can work on what he says and how he can change the dynamic of a conflictual conversation by changing his choice of words. Client reported he is working on showing he can be a good partner and plans to propose to her near [place]. Client reported he is still doing visitation with his daughter while she is in a AFL and working the process until she can live with him.    Suicidal/Homicidal: Nowithout intent/plan  Therapist Response:  Therapist began the session asking the client how he has been doing since last seen. Therapist used CBT to utilize active listening and positive emotional support. Therapist used CBT to ask the client how he has been coping with the transition of his relationship. Therapist used CBT to engage with the client to discuss positive communication skills. Therapist assigned the client homework to practice positive communication skills. Client was scheduled for next appointment.     Plan: Return again in 2 weeks.  Diagnosis: Bipolar 1 disorder, most recent episode depressed    Neena Rhymes Chukwudi Ewen, LCSW 08/18/2021

## 2021-08-25 ENCOUNTER — Telehealth (HOSPITAL_COMMUNITY): Payer: Self-pay | Admitting: *Deleted

## 2021-08-25 NOTE — Telephone Encounter (Signed)
Call from [facility], she did not identify the relationship to patient Kenneth Jimenez but would like to speak with provider Toy Cookey. Her number is [phone].

## 2021-08-26 ENCOUNTER — Other Ambulatory Visit: Payer: Self-pay

## 2021-08-26 ENCOUNTER — Encounter (HOSPITAL_COMMUNITY): Payer: Self-pay | Admitting: Psychiatry

## 2021-08-26 ENCOUNTER — Telehealth (INDEPENDENT_AMBULATORY_CARE_PROVIDER_SITE_OTHER): Payer: No Payment, Other | Admitting: Psychiatry

## 2021-08-26 DIAGNOSIS — F3130 Bipolar disorder, current episode depressed, mild or moderate severity, unspecified: Secondary | ICD-10-CM

## 2021-08-26 DIAGNOSIS — F331 Major depressive disorder, recurrent, moderate: Secondary | ICD-10-CM | POA: Diagnosis not present

## 2021-08-26 MED ORDER — MIRTAZAPINE 15 MG PO TABS
ORAL_TABLET | Freq: Every day | ORAL | 3 refills | Status: DC
  Filled 2021-08-26: qty 30, 30d supply, fill #0

## 2021-08-26 MED ORDER — QUETIAPINE FUMARATE 400 MG PO TABS
ORAL_TABLET | ORAL | 3 refills | Status: DC
  Filled 2021-08-26: qty 30, 30d supply, fill #0

## 2021-08-26 MED ORDER — HYDROXYZINE HCL 50 MG PO TABS
50.0000 mg | ORAL_TABLET | Freq: Three times a day (TID) | ORAL | 3 refills | Status: DC | PRN
  Filled 2021-08-26: qty 90, 30d supply, fill #0

## 2021-08-26 MED ORDER — PAROXETINE HCL 40 MG PO TABS
ORAL_TABLET | Freq: Every day | ORAL | 3 refills | Status: DC
  Filled 2021-08-26: qty 30, 30d supply, fill #0

## 2021-08-26 NOTE — Progress Notes (Signed)
BH MD/PA/NP OP Progress Note Virtual Visit via Telephone Note  I connected with Kenneth Jimenez on 08/26/21 at 10:30 AM EST by telephone and verified that I am speaking with the correct person using two identifiers.  Location: Patient: home Provider: Clinic   I discussed the limitations, risks, security and privacy concerns of performing an evaluation and management service by telephone and the availability of in person appointments. I also discussed with the patient that there may be a patient responsible charge related to this service. The patient expressed understanding and agreed to proceed.   I provided 30 minutes of non-face-to-face time during this encounter.      08/26/2021 11:10 AM Kenneth Jimenez  MRN:  [id number]  Chief Complaint: "I have been in a real bad funk because my girlfriend broke up with me"   HPI: 55 year old male seen today for follow up psychiatric evaluation.   He has a psychiatric history of bipolar disorder, PTSD, explosive disorder, depression, and cannabis use disorder.  Patient is currently prescribed  mirtazapine 15 mg at bedtime,  hydroxyzine 25 mg 3 times daily as needed, Paxil 40 mg daily, and Seroquel 400 HS. Patient notes that his medications are effective in managing his psychiatric conditions but notes that he has been down since he and his girlfriend broke up.    Today patient unable to logon virtually so assessment was done over the phone.  During exam he was hyper verbal, irritable, and pleasant, somewhat engaged in conversation. He informed provider that during the thanksgiving holiday she and his girlfriend rekindled there relationship. He reports that he had been taking his medications daily and did not have outburst while taking his medications. Patient reports he confronted his girlfriend about leaving him on hold and not explaining why which occurred 2-3 weeks ago. He now reports that she is now avoiding him which he notes has put him in a funk.    Patient notes that the above worsens his anxiety and depression. Patient unable to do a GAD 7 or a PHQ 9 as he is hyper-verbal and ruminated on the above.  He endorses adequate sleep and appetite. Today he denies SI/VAH, mania or   paranoia.  Patient notes that the above stresses him out however reports that he is able to cope with it. No medication changes made today.  Patient agreeable to continue medication as prescribed.  He will follow-up with outpatient counseling for therapy.  No other concerns noted at this time.       Visit Diagnosis:    ICD-10-CM   1. Bipolar I disorder, most recent episode depressed (HCC)  F31.30 QUEtiapine (SEROQUEL) 400 MG tablet    2. Moderate episode of recurrent major depressive disorder (HCC)  F33.1 mirtazapine (REMERON) 15 MG tablet    hydrOXYzine (ATARAX) 50 MG tablet    PARoxetine (PAXIL) 40 MG tablet       Past Psychiatric History:  bipolar disorder, PTSD, explosive disorder, depression, and cannabis use disorder  Past Medical History:  Past Medical History:  Diagnosis Date   Anorexia    Atrial fib/flutter, transient    Back pain    Bipolar 1 disorder (HCC)    Bipolar disorder (HCC)    Bradycardia    Chest pain    Coronary artery disease    Hyperthyroidism    Palpitations    Seizures (HCC)    Tachyarrhythmia    No past surgical history on file.  Family Psychiatric History:  PTSD, Depression, Bipolar disorder,   Family  History:  Family History  Problem Relation Age of Onset   Heart disease Mother    Diabetes Mother    Heart failure Mother    Heart disease Father    Heart attack Father     Social History:  Social History   Socioeconomic History   Marital status: Married    Spouse name: Not on file   Number of children: Not on file   Years of education: Not on file   Highest education level: Not on file  Occupational History   Not on file  Tobacco Use   Smoking status: Every Day    Packs/day: 0.25    Types:  Cigarettes   Smokeless tobacco: Current  Substance and Sexual Activity   Alcohol use: No   Drug use: Yes    Types: Marijuana   Sexual activity: Yes  Other Topics Concern   Not on file  Social History Narrative   Currently working to get medicaid.    Social Determinants of Health   Financial Resource Strain: Not on file  Food Insecurity: Not on file  Transportation Needs: Not on file  Physical Activity: Not on file  Stress: Not on file  Social Connections: Not on file    Allergies:  Allergies  Allergen Reactions   Penicillins Hives    Metabolic Disorder Labs: No results found for: HGBA1C, MPG No results found for: PROLACTIN No results found for: CHOL, TRIG, HDL, CHOLHDL, VLDL, LDLCALC Lab Results  Component Value Date   TSH 0.381 02/06/2013   TSH 0.376 03/21/2011    Therapeutic Level Labs: No results found for: LITHIUM Lab Results  Component Value Date   VALPROATE <10.0 (L) 12/22/2014   VALPROATE <10.0 (L) 09/11/2010   No components found for:  CBMZ  Current Medications: Current Outpatient Medications  Medication Sig Dispense Refill   chlorhexidine (PERIDEX) 0.12 % solution Use as directed 15 mLs in the mouth or throat 2 (two) times daily. Swish and spit.  Do not swallow. (Patient not taking: Reported on 06/18/2020) 120 mL 0   fluconazole (DIFLUCAN) 200 MG tablet Take 1 tablet (200 mg total) by mouth daily. (Patient not taking: Reported on 06/18/2020) 7 tablet 0   hydrOXYzine (ATARAX) 50 MG tablet Take 1 tablet (50 mg total) by mouth 3 (three) times daily as needed. 90 tablet 3   mirtazapine (REMERON) 15 MG tablet TAKE 1 TABLET (15 MG TOTAL) BY MOUTH AT BEDTIME. 30 tablet 3   PARoxetine (PAXIL) 40 MG tablet TAKE 1 TABLET (40 MG TOTAL) BY MOUTH DAILY. 30 tablet 3   QUEtiapine (SEROQUEL) 400 MG tablet TAKE 1 TABLET (400 MG TOTAL) BY MOUTH AT BEDTIME. 30 tablet 3   No current facility-administered medications for this visit.     Musculoskeletal: Strength &  Muscle Tone:  Unable to assess due to telephone visit Gait & Station:  Unable to assess due to telephone visit Patient leans: N/A  Psychiatric Specialty Exam: Review of Systems  There were no vitals taken for this visit.There is no height or weight on file to calculate BMI.  General Appearance:  Unable to assess due to telephone visit  Eye Contact:   Unable to assess due to telephone visit  Speech:  Clear and Coherent and Normal Rate  Volume:  Normal  Mood:  Anxious and Depressed  Affect:  Appropriate and Congruent  Thought Process:  Coherent, Goal Directed and Linear  Orientation:  Full (Time, Place, and Person)  Thought Content: Logical and Rumination   Suicidal  Thoughts:  No  Homicidal Thoughts:  No  Memory:  Immediate;   Good Recent;   Good Remote;   Good  Judgement:  Good  Insight:  Good  Psychomotor Activity:  Normal  Concentration:  Concentration: Good and Attention Span: Good  Recall:  Good  Fund of Knowledge: Good  Language: Good  Akathisia:  No  Handed:  Left  AIMS (if indicated): Not done  Assets:  Communication Skills Desire for Improvement Financial Resources/Insurance Housing Intimacy Leisure Time Social Support  ADL's:  Intact  Cognition: WNL  Sleep:  Good   Screenings: GAD-7    Flowsheet Row Video Visit from 02/22/2021 in [facility] Video Visit from 11/26/2020 in [facility]  Total GAD-7 Score 15 20      PHQ2-9    Flowsheet Row Video Visit from 02/22/2021 in [facility] Video Visit from 11/26/2020 in [facility] Counselor from 05/26/2020 in [facility]  PHQ-2 Total Score 4 6 2   PHQ-9 Total Score 12 20 5       Flowsheet Row Video Visit from 11/26/2020 in [facility] ED from 06/17/2020 in [facility] EMERGENCY DEPARTMENT  C-SSRS RISK CATEGORY No Risk Error: Q3, 4,  or 5 should not be populated when Q2 is No        Assessment and Plan: Patient notes that his anxiety and depression has increased since he broke up with his girlfriend. He reports however that his medications are working. No medication changes made today.  Patient agreeable to continue medication as prescribed.  1. Moderate episode of recurrent major depressive disorder (HCC)  Continue- mirtazapine (REMERON) 15 MG tablet; Take 1 tablet (15 mg total) by mouth at bedtime.  Dispense: 30 tablet; Refill: 3 Continue- PARoxetine (PAXIL) 40 MG tablet; Take 1 tablets (40 mg total) by mouth daily.  Dispense: 30 tablet; Refill: 3 Continue- hydrOXYzine (ATARAX/VISTARIL) 25 MG tablet; Take 1 tablet (25 mg total) by mouth 3 (three) times daily as needed.  Dispense: 30 tablet; Refill: 3  2. Bipolar I disorder, most recent episode depressed (HCC)  Continue- QUEtiapine (SEROQUEL) 400 MG tablet; Take 1 tablet (400 mg total) by mouth at bedtime.  Dispense: 30 tablet; Refill: 3    Follow up in 3 months Follow up with outpatient therapist.   08/17/2020, NP 08/26/2021, 11:10 AM
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
## 2021-09-02 ENCOUNTER — Other Ambulatory Visit: Payer: Self-pay

## 2021-09-07 ENCOUNTER — Ambulatory Visit (INDEPENDENT_AMBULATORY_CARE_PROVIDER_SITE_OTHER): Payer: No Payment, Other | Admitting: Clinical

## 2021-09-07 ENCOUNTER — Ambulatory Visit (HOSPITAL_COMMUNITY): Payer: Self-pay | Admitting: Clinical

## 2021-09-07 DIAGNOSIS — F3130 Bipolar disorder, current episode depressed, mild or moderate severity, unspecified: Secondary | ICD-10-CM | POA: Diagnosis not present

## 2021-09-07 NOTE — Progress Notes (Signed)
THERAPIST PROGRESS NOTE Virtual Visit via Telephone Note  I connected with Kenneth Jimenez on 09/07/21 at 10:00 AM EST by telephone and verified that I am speaking with the correct person using two identifiers.  Location: Patient: home Provider: office   I discussed the limitations, risks, security and privacy concerns of performing an evaluation and management service by telephone and the availability of in person appointments. I also discussed with the patient that there may be a patient responsible charge related to this service. The patient expressed understanding and agreed to proceed.   Follow Up Instructions: I discussed the assessment and treatment plan with the patient. The patient was provided an opportunity to ask questions and all were answered. The patient agreed with the plan and demonstrated an understanding of the instructions.   The patient was advised to call back or seek an in-person evaluation if the symptoms worsen or if the condition fails to improve as anticipated.   Session Time: 45 minutes  Participation Level: Active  Behavioral Response: NAAlertEuthymic  Type of Therapy: Individual Therapy  Treatment Goals addressed: Coping  Interventions: CBT and Supportive  Summary:  Kenneth Jimenez is a 55 y.o. male who presents for the scheduled session oriented times five, appropriately dressed, and friendly. Client denied hallucinations and delusions. Client reported a lot has happened since his last session. Client reported his ex girlfriend put out a restraining order against him. Client reported prior to her putting it in place they had a conversation in which she alluded to considering having it done. Client reported in his response he stated, "I'll give you a reason to take out a 50b". Client reported he did describe to her what exactly he meant by that but her response was the restraining order. Client reported in the report she alleged feeling worried for her  safety. Client reported feeling angry, confused and saddened by how he is allegedly being portrayed by the report. Client reported he tried to be honest about his mental health and he openly invited her to listen in on his appointments. Client reported feeling upset because they broke up over the phone. Client reported he also felt confused because they were spending time together at her house leading up to Thanksgiving which was the same weekend she took out the restraining order. Client reported they were planning a wedding and states having pictures of her in the wedding dress she tried on. Client reported he treated her and her kids respectfully. Client reported feeling upset because as he is working on getting custody of his daughter from social services this could prevent that from happening. Client reported if this impacts his process of custody for his daughter he anticipates "losing my mind and going into a deep depression". Client discussed with the therapist in his time of evaluating the situation and how he talked to her he see's how his statement was viewed as threatening. Client reported in retaliation to her report he called social services to make a report against her. Client reported he understands it is not the right thing to do and he will call them tomorrow to recant his report. Client reported it wa snot right for him to make it personal. Client reported acknowledging he needs to continuously work on his tone and impulse control when he starts to feel upset. Client report when he starts to feel agitated he gets quiet and balls his fist up. Client reported he impulsively responds based on his emotions and rationalizes later once he is  calm. Client reported he has an upcoming court date January 26 for the restraining order and inquired about his counselor and psychiatrist attending court as well. Client reported he will speaking to his lawyer about what he needs to do for the  case.   Suicidal/Homicidal: Nowithout intent/plan  Therapist Response:  Therapist began the appointment asking the client how he has been doing since his last therapy appointment. Therapist used CBT to utilize active listening and positive emotional support towards his thoughts and feelings. Therapist used CBT to engage and ask the client to identify and describe the trigger for his recent stressor. Therapist used CBT to engage with the client to discuss impulse control techniques for anger. Therapist assigned the client homework to practice mindful body scanning scanning for physical reactions that show as a early warning sign before responding in triggering situations. Client was scheduled for next appointment.    Plan: Return again in 4 weeks.  Diagnosis: Bipolar 1 disorder, most recent episode, depressed   Neena Rhymes Diago Haik, LCSW 09/07/2021

## 2021-09-10 ENCOUNTER — Telehealth (HOSPITAL_COMMUNITY): Payer: Self-pay | Admitting: Clinical

## 2021-09-10 NOTE — Telephone Encounter (Signed)
Therapist returned a phone call to the client at his request that was left with front desk staff.  Therapist was able to make phone contact with client.  Client reported that he spoke with the court clerk to receive information for this therapist in his psychiatrist to attend his upcoming court date on January 26 via WebEx related to his case with his ex-girlfriend.  Client reported he has a Clinical research associate who has not gotten back in touch with him.  Therapist informed the client that therapist supervisor will need to be contacted about the clients request.  Therapist reported to the client she will get back in touch with him about his request after speaking to management.  Client was in agreement.

## 2021-09-17 ENCOUNTER — Telehealth (HOSPITAL_COMMUNITY): Payer: Self-pay | Admitting: *Deleted

## 2021-09-17 NOTE — Telephone Encounter (Signed)
Call from patient asking to speak to Paige C and Dr Doyne Keel re being in court with him on the 26 of jan or at least be in court thru the video screen. I told him I would give them both the message. He escalated with cause re not being able to get a hold of anybody for a week and no one calling him back. He then asked to speak with my supervisor and I offered I would give him the message and ask him to call him. Asked why he could not speak with him now, did we not care enough to speak with him. Said the supervisor should have an Geophysicist/field seismologist there now to speak with him when I told him he was busy and not here in this building today but he would call him. Continue to loudly complain that he was being ignored and I was telling him we didn't have time for him etc. I told him I did not say that  but would have him call him, told him I would not continue to argue about something I did not say and had no control over. I will forward his messages. He said he would call [facility] and complaint.

## 2021-09-17 NOTE — Telephone Encounter (Signed)
The patient has been advised by Derenda Mis at his last visit that providers can not go to court without a subpoena. For any of [place] to go to court, or release medical records, we would have to be served a Warden/ranger. Administrative staff plans to call patient today to address his concerns further.  Writer has spoken to patient monthly (07/07/21, 07/29/21, and 08/26/2021) with similar concerns. At patients last visit he reported that his medications were effective but reported relationship conflicts that caused increased stress. If patient needs mental health care he can walk into the clinic on Monday or Tuesday between the hour of 8:00-11:00 am.

## 2021-09-20 NOTE — Telephone Encounter (Signed)
Medication management - Telephone call with pt. to follow-up on his request for Dr. Toy Cookey and Deloris Ping, his therapist to represent him in court for a charge on 10/07/21.  Attempted to explain to pt this would require subpoenas for any providers to be able to go to court or any records to be shared.  Patient was not in agreement with this as she stated I have and will sign consents for his therapist and/or provider to attend to represent him.  Informed him several times they could not just go to court on his behalf, that they could only attend if court ordered with a subpoena. After much discussion patient was not receptive to this so agreed to have [facility] legal help provide what is allowable and why.  Note sent for clarification and agreed to contact patient back as his court date is 10/07/21.  Patient also wanted to let Dr. Doyne Keel know he is getting headaches more lately and his head is itching at times and he just wanted to make sure none of his medications were causing this.  Patient denied starting anything new recently and agreed to send question to provider as he reported not doing anything different.

## 2021-09-21 NOTE — Telephone Encounter (Signed)
Provider spoke to patient who notes that he has been frustrated.  He notes that his ex-girlfriend Joyce Gross has placed a restraining order against him.  He also notes that she went before a judge and informed him/her he was suicidal.  He informed Clinical research associate that he is not suicidal and denies wanting to harm himself or others.  He asked Clinical research associate to summarize a list of his past visits.  Provider informed patient that this would not be done as it is more accurate to go by what was stated in the chart. Provider endorsed understanding and notes that his records are available to him.  He endorsed understanding and agreed.  Patient informed Clinical research associate again that this process is frustrating for him and request that provider go to court on his behalf.  Provider informed patient that she would not show up in court unless subpoenaed.  He endorsed understanding.   Patient informed administrative staff that he has been itching recently.  Provider asked patient if he had been taking his medications as prescribed and he notes that he was.  He denies recent change in medications.  At patient's last visit medications were not adjusted.  He denied side effects at that time.  He informed Clinical research associate that he finds his medications are effective and notes that he did not have them he would probably be in jail.  Provider recommended patient see his PCP for further evaluation of his itching.  He endorsed understanding and agreed.  Patient does take hydroxyzine for anxiety but it also can be utilized for itching.  At this time no medication changes were made.  Patient will follow up on 11/15/2021.  No other concerns noted at this time.

## 2021-10-19 ENCOUNTER — Ambulatory Visit (INDEPENDENT_AMBULATORY_CARE_PROVIDER_SITE_OTHER): Payer: No Payment, Other | Admitting: Clinical

## 2021-10-19 DIAGNOSIS — F3130 Bipolar disorder, current episode depressed, mild or moderate severity, unspecified: Secondary | ICD-10-CM

## 2021-10-20 NOTE — Progress Notes (Signed)
° °  THERAPIST PROGRESS NOTE Virtual Visit via Video Note  I connected with Kenneth Jimenez on 10/19/2021 at  1:00 PM EST by a video enabled telemedicine application and verified that I am speaking with the correct person using two identifiers.  Location: Patient: home Provider: office   I discussed the limitations of evaluation and management by telemedicine and the availability of in person appointments. The patient expressed understanding and agreed to proceed.   Follow Up Instructions: I discussed the assessment and treatment plan with the patient. The patient was provided an opportunity to ask questions and all were answered. The patient agreed with the plan and demonstrated an understanding of the instructions.   The patient was advised to call back or seek an in-person evaluation if the symptoms worsen or if the condition fails to improve as anticipated.   Session Time: 25 minutes  Participation Level: Active  Behavioral Response: CasualAlertEuthymic  Type of Therapy: Individual Therapy  Treatment Goals addressed: Coping  Interventions: CBT and Supportive  Summary:  Kenneth Jimenez is a 56 y.o. male who presents for the scheduled session oriented x5, and cooperative.  Client denied hallucinations and delusions. Client reported on today that he is doing better since he was last seen.  Client reported that the court date involving his ex-fianc at the end of January.  Client reported they were able to come to a mutual agreement that he would not contact her for a year.  Client reported he learned that if he tried to challenge her motion that it would have also negatively impacted his ongoing process for getting custody of his daughter.  Client reported he has learned a lot from the relationship with his ex-fianc.  Client reported he acknowledged that his intentions were not bad but something he said that he reacted to something she took it as making her feel uncomfortable which she  cannot be little from her perspective.  Client reported that he has been told from others that know him that he is a nice person but he talks loudly and everybody is not going to be receptive to his communication style.  Client reported otherwise on today he had his virtual visitation with his daughter and that went well.  Client reported he has the hopes that she will be able to do weekend visitations with him so they can get accustomed to being around each other before she eventually is and has full custody.   Suicidal/Homicidal: Nowithout intent/plan  Therapist Response:  Therapist began the appointment asking client how he has been doing since last seen. Therapist used CBT to utilize active listening and positive emotional support towards his thoughts and feelings. Therapist used CBT to engage the client to ask him how he is anticipated for treatment. Therapist used CBT to engage the client to discuss communication styles and how they can be interpreted by others. Client was scheduled for next appointment.    Plan: Return again in 5 weeks.  Diagnosis: Bipolar 1 disorder, most recent episode depressed  Kenneth Cham Y Roderick Sweezy, LCSW 10/19/2021

## 2021-11-09 ENCOUNTER — Ambulatory Visit (INDEPENDENT_AMBULATORY_CARE_PROVIDER_SITE_OTHER): Payer: No Payment, Other | Admitting: Clinical

## 2021-11-09 DIAGNOSIS — F3130 Bipolar disorder, current episode depressed, mild or moderate severity, unspecified: Secondary | ICD-10-CM

## 2021-11-09 NOTE — Progress Notes (Signed)
THERAPIST PROGRESS NOTE Virtual Visit via Telephone Note  I connected with Kenneth Jimenez on 11/09/21 at  1:00 PM EST by telephone and verified that I am speaking with the correct person using two identifiers.  Location: Patient: home Provider: office   I discussed the limitations, risks, security and privacy concerns of performing an evaluation and management service by telephone and the availability of in person appointments. I also discussed with the patient that there may be a patient responsible charge related to this service. The patient expressed understanding and agreed to proceed.   Follow Up Instructions: I discussed the assessment and treatment plan with the patient. The patient was provided an opportunity to ask questions and all were answered. The patient agreed with the plan and demonstrated an understanding of the instructions.   The patient was advised to call back or seek an in-person evaluation if the symptoms worsen or if the condition fails to improve as anticipated.   Session Time: 40 minutes  Participation Level: Active  Behavioral Response: CasualAlertEuthymic  Type of Therapy: Individual Therapy  Treatment Goals addressed: WILL IDENTIFY NEGATIVE BEHAVIORS THAT INTERFERE WITH IMPROVED INTERPERSONAL AND SOCIAL INTERACTION ONCE PER SESSION   ProgressTowards Goals: Not Progressing  Interventions: CBT and Supportive  Summary:  Kenneth Jimenez is a 56 y.o. male who presents for the scheduled session oriented times five, oriented times five, appropriately dressed, and friendly. Client denied hallucinations and delusions. Client reported on today he is doing well. Client reported he has been reflecting over his previous relationship with his former fiance. Client reported he has learned a lot from the relationship. Client reported "I can't make someone want to be with me". Client reported her behavior at the end of the relationship was a build up of how she was  probably feeling over some time. Client reported he admits to probably not noticing signs of her not being comfortable in the relationship. Client reported he has been a somewhat upset because he saw his ex was on facebook live speaking negatively about their relationship. Client reported it hurt his feelings because he has worked so hard to improve himself and how he treats people. Client reported he has been told by others that he has a loud tone of voice which comes across as aggressive. Client reported he does not speak loudly to intimidate others but that's how he has always been since a child. Client reported he is proud of his progress with how he has been able to step away from the relationship despite going to court. Client reported when he left his previous relationship it resulted with an IVC and him acting out. Client reported with his recent breakup he was able to acknowledge his faults and attempt to correct his wrong doings. Client reported he wants to work on lowering his tone of voice and communication skills with others as a treatment goal. Client reported making progress with slower reaction to anger but zero progress with improvement of his tone of voice to improve social interaction.   Suicidal/Homicidal: Nowithout intent/plan  Therapist Response:  Therapist began the appointment asking the client how he has been doing since last seen. Therapist used CBT to use active listening and positive emotional support towards his thoughts and feelings. Therapist used CBT to have the client describe the stressor which has negatively impacted his mood. Therapist used CBT to discuss improving reaction to stressor by using education on mindfulness and cognitive restructuring. Therapist used CBT to address progress towards treatment goals. Therapist  assigned the client homework to practice mindfulness with his communication skills. Treatment plan was updated. Client was scheduled for next  appointment.     Plan: Return again in 5 weeks.  Diagnosis:   bipolar 1 disorder, most recent episode, depressed  Collaboration of Care: Patient refused AEB none reported at this time.  Patient/Guardian was advised Release of Information must be obtained prior to any record release in order to collaborate their care with an outside provider. Patient/Guardian was advised if they have not already done so to contact the registration department to sign all necessary forms in order for [place] to release information regarding their care.   Consent: Patient/Guardian gives verbal consent for treatment and assignment of benefits for services provided during this visit. Patient/Guardian expressed understanding and agreed to proceed.   Neena Rhymes Lashara Urey, LCSW 11/09/2021

## 2021-11-10 NOTE — Plan of Care (Signed)
Client was scheduled for next appointment. ?

## 2021-11-15 ENCOUNTER — Other Ambulatory Visit: Payer: Self-pay

## 2021-11-15 ENCOUNTER — Ambulatory Visit (INDEPENDENT_AMBULATORY_CARE_PROVIDER_SITE_OTHER): Payer: No Payment, Other | Admitting: Psychiatry

## 2021-11-15 DIAGNOSIS — F331 Major depressive disorder, recurrent, moderate: Secondary | ICD-10-CM

## 2021-11-15 DIAGNOSIS — F3130 Bipolar disorder, current episode depressed, mild or moderate severity, unspecified: Secondary | ICD-10-CM | POA: Diagnosis not present

## 2021-11-15 MED ORDER — QUETIAPINE FUMARATE 400 MG PO TABS
ORAL_TABLET | ORAL | 2 refills | Status: DC
  Filled 2021-11-15: qty 30, 30d supply, fill #0

## 2021-11-15 MED ORDER — MIRTAZAPINE 15 MG PO TABS
ORAL_TABLET | Freq: Every day | ORAL | 2 refills | Status: DC
  Filled 2021-11-15: qty 30, 30d supply, fill #0

## 2021-11-15 MED ORDER — PAROXETINE HCL 40 MG PO TABS
ORAL_TABLET | Freq: Every day | ORAL | 2 refills | Status: DC
Start: 2021-11-15 — End: 2022-02-14
  Filled 2021-11-15: qty 30, 30d supply, fill #0

## 2021-11-15 MED ORDER — HYDROXYZINE HCL 50 MG PO TABS
50.0000 mg | ORAL_TABLET | Freq: Three times a day (TID) | ORAL | 2 refills | Status: DC | PRN
  Filled 2021-11-15: qty 90, 30d supply, fill #0

## 2021-11-15 NOTE — Progress Notes (Signed)
,[facility] MD/PA/NP OP Progress Note  11/15/2021 11:57 AM Kenneth Jimenez  MRN:  [id number] Virtual Visit via Telephone Note  I connected with Kenneth Jimenez on 11/15/21 at 11:30 AM EST by telephone and verified that I am speaking with the correct person using two identifiers.  Location: Patient: home Provider: clinic   I discussed the limitations, risks, security and privacy concerns of performing an evaluation and management service by telephone and the availability of in person appointments. I also discussed with the patient that there may be a patient responsible charge related to this service. The patient expressed understanding and agreed to proceed.    I discussed the assessment and treatment plan with the patient. The patient was provided an opportunity to ask questions and all were answered. The patient agreed with the plan and demonstrated an understanding of the instructions.   The patient was advised to call back or seek an in-person evaluation if the symptoms worsen or if the condition fails to improve as anticipated.  I provided 20 minutes of non-face-to-face time during this encounter.   Kenneth Sober, NP  Chief Complaint: No chief complaint on file.  HPI: Kenneth Jimenez is a 56 year old male presenting to [facility] behavioral health outpatient for follow-up psychiatric evaluation.  He has a psychiatric history of bipolar disorder and major depressive disorder.  His symptoms are managed with hydroxyzine 50 mg 3 times daily, mirtazapine 15 mg at bedtime, Paxil 40 mg daily, and Seroquel 400 mg at bedtime.  Patient reports medication compliance and effectiveness but patient reports difficulty sleeping at night. Patient is alert and oriented x4, calm, very talkative and willing to engage.  He reports okay mood but appears irritable when discussing his relationship status with his significant other. He reports relationship issues with his significant other.  Patient reports that his  significant other has posted his information on Facebook discussing his mental health.  Reports severing the relationship with his significant other. He reports losing 23 pounds since January due to a decreased appetite due to stress. He reports that his appetite has increased today. He denies suicidal or homicidal ideations, paranoia, delusions, or visual or auditory hallucinations.  Visit Diagnosis:    ICD-10-CM   1. Moderate episode of recurrent major depressive disorder (HCC)  F33.1 hydrOXYzine (ATARAX) 50 MG tablet    mirtazapine (REMERON) 15 MG tablet    PARoxetine (PAXIL) 40 MG tablet    2. Bipolar I disorder, most recent episode depressed (HCC)  F31.30 QUEtiapine (SEROQUEL) 400 MG tablet      Past Psychiatric History:   Past Medical History:  Past Medical History:  Diagnosis Date   Anorexia    Atrial fib/flutter, transient    Back pain    Bipolar 1 disorder (HCC)    Bipolar disorder (HCC)    Bradycardia    Chest pain    Coronary artery disease    Hyperthyroidism    Palpitations    Seizures (HCC)    Tachyarrhythmia    No past surgical history on file.  Family Psychiatric History:   Family History:  Family History  Problem Relation Age of Onset   Heart disease Mother    Diabetes Mother    Heart failure Mother    Heart disease Father    Heart attack Father     Social History:  Social History   Socioeconomic History   Marital status: Married    Spouse name: Not on file   Number of children: Not on file   Years  of education: Not on file   Highest education level: Not on file  Occupational History   Not on file  Tobacco Use   Smoking status: Every Day    Packs/day: 0.25    Types: Cigarettes   Smokeless tobacco: Current  Substance and Sexual Activity   Alcohol use: No   Drug use: Yes    Types: Marijuana   Sexual activity: Yes  Other Topics Concern   Not on file  Social History Narrative   Currently working to get medicaid.    Social Determinants  of Health   Financial Resource Strain: Not on file  Food Insecurity: Not on file  Transportation Needs: Not on file  Physical Activity: Not on file  Stress: Not on file  Social Connections: Not on file    Allergies:  Allergies  Allergen Reactions   Penicillins Hives    Metabolic Disorder Labs: No results found for: HGBA1C, MPG No results found for: PROLACTIN No results found for: CHOL, TRIG, HDL, CHOLHDL, VLDL, LDLCALC Lab Results  Component Value Date   TSH 0.381 02/06/2013   TSH 0.376 03/21/2011    Therapeutic Level Labs: No results found for: LITHIUM Lab Results  Component Value Date   VALPROATE <10.0 (L) 12/22/2014   VALPROATE <10.0 (L) 09/11/2010   No components found for:  CBMZ  Current Medications: Current Outpatient Medications  Medication Sig Dispense Refill   chlorhexidine (PERIDEX) 0.12 % solution Use as directed 15 mLs in the mouth or throat 2 (two) times daily. Swish and spit.  Do not swallow. (Patient not taking: Reported on 06/18/2020) 120 mL 0   fluconazole (DIFLUCAN) 200 MG tablet Take 1 tablet (200 mg total) by mouth daily. (Patient not taking: Reported on 06/18/2020) 7 tablet 0   hydrOXYzine (ATARAX) 50 MG tablet Take 1 tablet (50 mg total) by mouth 3 (three) times daily as needed for anxiety (may take 2 tabs at bedtime). 90 tablet 2   mirtazapine (REMERON) 15 MG tablet TAKE 1 TABLET (15 MG TOTAL) BY MOUTH AT BEDTIME. 30 tablet 2   PARoxetine (PAXIL) 40 MG tablet TAKE 1 TABLET (40 MG TOTAL) BY MOUTH DAILY. 30 tablet 2   QUEtiapine (SEROQUEL) 400 MG tablet TAKE 1 TABLET (400 MG TOTAL) BY MOUTH AT BEDTIME. 30 tablet 2   No current facility-administered medications for this visit.     Musculoskeletal: Strength & Muscle Tone:  n/a Gait & Station:  n/a Patient leans: N/A  Psychiatric Specialty Exam: Review of Systems  Psychiatric/Behavioral:  Negative for self-injury and suicidal ideas.   All other systems reviewed and are negative.  There were no  vitals taken for this visit.There is no height or weight on file to calculate BMI.  General Appearance: NA  Eye Contact:  NA  Speech:  Clear and Coherent  Volume:  Normal  Mood:  Irritable  Affect:  NA  Thought Process:  Goal Directed  Orientation:  Full (Time, Place, and Person)  Thought Content: Logical   Suicidal Thoughts:  No  Homicidal Thoughts:  No  Memory: Good  Judgement: Good  Insight: Good  Psychomotor Activity: N/A  Concentration: Good  Recall: Good  Fund of Knowledge: Good  Language: Good  Akathisia: N/A  Handed:  Right  AIMS (if indicated): not done  Assets:  Communication Skills Desire for Improvement  ADL's:  Intact  Cognition: WNL  Sleep:  Poor   Screenings: GAD-7    Flowsheet Row Video Visit from 02/22/2021 in [facility] Video  Visit from 11/26/2020 in [facility]  Total GAD-7 Score 15 20      PHQ2-9    Flowsheet Row Video Visit from 02/22/2021 in [facility] Video Visit from 11/26/2020 in [facility] Counselor from 05/26/2020 in [facility]  PHQ-2 Total Score 4 6 2   PHQ-9 Total Score 12 20 5       Flowsheet Row Video Visit from 11/26/2020 in [facility] ED from 06/17/2020 in [facility] EMERGENCY DEPARTMENT  C-SSRS RISK CATEGORY No Risk Error: Q3, 4, or 5 should not be populated when Q2 is No        Assessment and Plan: Kenneth Jimenez is a 56 year old male presenting to [facility] behavioral health outpatient for follow-up psychiatric evaluation.  He has a psychiatric history of bipolar disorder and major depressive disorder.  His symptoms are managed with hydroxyzine 50 mg 3 times daily, mirtazapine 15 mg at bedtime, Paxil 40 mg daily, and Seroquel 400 mg at bedtime.  Patient denies adverse medication reactions, reports medication compliance and effectiveness  but patient reports difficulty sleeping at night.  Patient instructed that he can take 2 tablets of hydroxyzine at bedtime to equal 100 mg to decrease anxiety and for sleep.  Medication benefits versus risk discussed.  Further medication changes today.  Occasions refilled.  Collaboration of Care: Collaboration of Care: Medication Management AEB medications E scribed to patient's preferred pharmacy.  1. Moderate episode of recurrent major depressive disorder (HCC)  - hydrOXYzine (ATARAX) 50 MG tablet; Take 1 tablet (50 mg total) by mouth 3 (three) times daily as needed for anxiety (may take 2 tabs at bedtime).  Dispense: 90 tablet; Refill: 2 - mirtazapine (REMERON) 15 MG tablet; TAKE 1 TABLET (15 MG TOTAL) BY MOUTH AT BEDTIME.  Dispense: 30 tablet; Refill: 2 - PARoxetine (PAXIL) 40 MG tablet; TAKE 1 TABLET (40 MG TOTAL) BY MOUTH DAILY.  Dispense: 30 tablet; Refill: 2  2. Bipolar I disorder, most recent episode depressed (HCC)  - QUEtiapine (SEROQUEL) 400 MG tablet; TAKE 1 TABLET (400 MG TOTAL) BY MOUTH AT BEDTIME.  Dispense: 30 tablet; Refill: 2   Follow-up in 3 months   Patient/Guardian was advised Release of Information must be obtained prior to any record release in order to collaborate their care with an outside provider. Patient/Guardian was advised if they have not already done so to contact the registration department to sign all necessary forms in order for 56 to release information regarding their care.   Consent: Patient/Guardian gives verbal consent for treatment and assignment of benefits for services provided during this visit. Patient/Guardian expressed understanding and agreed to proceed.    [facility], NP 11/15/2021, 11:57 AM

## 2021-11-22 ENCOUNTER — Other Ambulatory Visit: Payer: Self-pay

## 2021-12-01 ENCOUNTER — Telehealth (HOSPITAL_COMMUNITY): Payer: Self-pay | Admitting: Clinical
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
## 2021-12-14 ENCOUNTER — Ambulatory Visit (INDEPENDENT_AMBULATORY_CARE_PROVIDER_SITE_OTHER): Payer: No Payment, Other | Admitting: Clinical

## 2021-12-14 DIAGNOSIS — F3130 Bipolar disorder, current episode depressed, mild or moderate severity, unspecified: Secondary | ICD-10-CM

## 2021-12-15 ENCOUNTER — Encounter (HOSPITAL_COMMUNITY): Payer: Self-pay

## 2021-12-15 NOTE — Progress Notes (Signed)
?THERAPIST PROGRESS NOTE ?Virtual Visit via Video Note ? ?I connected with [name] on 12/14/2021 at  4:00 PM EDT by a video enabled telemedicine application and verified that I am speaking with the correct person using two identifiers. ? ?Location: ?Patient: home ?Provider: office ?  ?I discussed the limitations of evaluation and management by telemedicine and the availability of in person appointments. The patient expressed understanding and agreed to proceed. ? ? ?Follow Up Instructions: ?I discussed the assessment and treatment plan with the patient. The patient was provided an opportunity to ask questions and all were answered. The patient agreed with the plan and demonstrated an understanding of the instructions. ?  ?The patient was advised to call back or seek an in-person evaluation if the symptoms worsen or if the condition fails to improve as anticipated. ? ? ? ?Session Time: 30 minutes ? ?Participation Level: Active ? ?Behavioral Response: NAAlertEuthymic ? ?Type of Therapy: Individual Therapy ? ?Treatment Goals addressed: Client will identify negative behaviors that interfere with improved interpersonal and social interaction once per session ? ?ProgressTowards Goals: Progressing ? ?Interventions: CBT and Supportive ? ?Summary:  ?Cristy Abila is a 56 y.o. male who presents for the scheduled session oriented times five and cooperative. Client denied hallucinations and delusions. ?Client reported on today he is doing well. Client reported since he was last seen he attended a custody hearing.  Client reported the courts determined that he will have full custody of his daughter beginning on February 10, 2022. Client reported also his ex-girlfriend that took him to court has reach back out to him to start rebuilding their friendship.  Client reported he is happy about it but is taking things slow.  Client reported from distress over the past 6 months he noticed that he lost approximately 30 pounds. Client  reported anybody who causes him stress he is working on disconnecting their relationship to him. Client reported he has been reflecting over what he learned during his relationship to his ex. Client reported he allowed others within his family to have a negative influence about her behaviors and he took that and became angry. Client reported he cannot go by the word of others and learn to ask questions and problem solve things for himself. Client reported one of his biggest problems is learning to change his tone of voice. Client reported his ex let him hear back the voice mails he left on her phone. Client reported he was in disbelief hearing back how loud and aggressive he sounded. ?Evidence of progress towards goal:  client reported he is self aware about at least 1 negative behaviors that affect his interpersonal relationship. ? ? ?Suicidal/Homicidal: Nowithout intent/plan ? ?Therapist Response:  ?Therapist began the appointment asking the client how he has been doing since last seen. ?Therapist used CBT to engage using active listening and positive emotional support towards his thoughts and feelings. ?Therapist used CBT to have the client identify positive changes that have occurred. ?Therapist used CBT to ask the client to identify barriers that negatively impact his relationship with others and his communication with them. ?Therapist used CBT ask the client to identify his progress with frequency of use with coping skills with continued practice in his daily activity.    ?Therapist assigned the client homework to practice with himself and/or a trusted person recording his tone and/or receiving feedback about his tone of voices and chosen words during conversations. ?Client was scheduled for next appointment. ? ? ? ?Plan: Return again in 5  weeks. ? ?Diagnosis: bipolar 1 disorder most recent episode depressed ? ?Collaboration of Care: Patient refused AEB none requested by the client at this  time. ? ?Patient/Guardian was advised Release of Information must be obtained prior to any record release in order to collaborate their care with an outside provider. Patient/Guardian was advised if they have not already done so to contact the registration department to sign all necessary forms in order for [place] to release information regarding their care.  ? ?Consent: Patient/Guardian gives verbal consent for treatment and assignment of benefits for services provided during this visit. Patient/Guardian expressed understanding and agreed to proceed.  ? ?Birdena Jubilee Jiovani Mccammon, LCSW ?12/14/2021 ? ?

## 2021-12-15 NOTE — Plan of Care (Signed)
Client was in agreement to the plan. ?

## 2022-01-28 ENCOUNTER — Telehealth (HOSPITAL_COMMUNITY): Payer: Self-pay | Admitting: Psychiatry

## 2022-01-28 NOTE — Telephone Encounter (Signed)
Patient called in requesting therapy appts twice a month. Explained to patient that we are currently booked out until July for therapy which is when his next appt is. Patient requested a call from the provider that is covering for Dr Doyne Keel  to speak to about current med issue.

## 2022-02-14 ENCOUNTER — Encounter (HOSPITAL_COMMUNITY): Payer: Self-pay | Admitting: Psychiatry

## 2022-02-14 ENCOUNTER — Ambulatory Visit (INDEPENDENT_AMBULATORY_CARE_PROVIDER_SITE_OTHER): Payer: No Payment, Other | Admitting: Psychiatry

## 2022-02-14 ENCOUNTER — Other Ambulatory Visit: Payer: Self-pay

## 2022-02-14 DIAGNOSIS — F331 Major depressive disorder, recurrent, moderate: Secondary | ICD-10-CM

## 2022-02-14 DIAGNOSIS — F3130 Bipolar disorder, current episode depressed, mild or moderate severity, unspecified: Secondary | ICD-10-CM

## 2022-02-14 MED ORDER — PAROXETINE HCL 40 MG PO TABS
ORAL_TABLET | Freq: Every day | ORAL | 3 refills | Status: DC
  Filled 2022-02-14: qty 30, 30d supply, fill #0

## 2022-02-14 MED ORDER — MIRTAZAPINE 30 MG PO TABS
30.0000 mg | ORAL_TABLET | Freq: Every day | ORAL | 3 refills | Status: DC
  Filled 2022-02-14: qty 30, 30d supply, fill #0

## 2022-02-14 MED ORDER — QUETIAPINE FUMARATE 400 MG PO TABS
ORAL_TABLET | ORAL | 3 refills | Status: DC
  Filled 2022-02-14: qty 30, 30d supply, fill #0

## 2022-02-14 MED ORDER — HYDROXYZINE HCL 50 MG PO TABS
50.0000 mg | ORAL_TABLET | Freq: Three times a day (TID) | ORAL | 3 refills | Status: DC | PRN
  Filled 2022-02-14: qty 90, 30d supply, fill #0

## 2022-02-14 NOTE — Progress Notes (Signed)
BH MD/PA/NP OP Progress Note Virtual Visit via Telephone Note  I connected with Kenneth Jimenez on 02/14/22 at  1:00 PM EDT by telephone and verified that I am speaking with the correct person using two identifiers.  Location: Patient: home Provider: Clinic   I discussed the limitations, risks, security and privacy concerns of performing an evaluation and management service by telephone and the availability of in person appointments. I also discussed with the patient that there may be a patient responsible charge related to this service. The patient expressed understanding and agreed to proceed.   I provided 30 minutes of non-face-to-face time during this encounter.      02/14/2022 10:41 AM Kenneth Jimenez  MRN:  [id number]  Chief Complaint: "I been having crying spells"   HPI: 56 year old male seen today for follow up psychiatric evaluation.   He has a psychiatric history of bipolar disorder, PTSD, explosive disorder, depression, and cannabis use disorder.  Patient is currently prescribed  mirtazapine 15 mg at bedtime,  hydroxyzine 50 mg 3 times daily as needed, Paxil 40 mg daily, and Seroquel 400 HS. Patient notes that his medications are somewhat effective in managing his psychiatric conditions.    Today patient unable to logon virtually so assessment was done over the phone.  During exam he was hyper verbal, pleasant, cooperative,and engaged in conversation. He informed provider that he has been having crying spells due to life stressors. He notes that his ex girl friend slandered his name on social medica. He reports that he was concerned that this would jeopardize him getting custody of his child. He however he notes that he recently got custody of his daughter and notes that when she speaks to him about her past trauma and saddened him.  Today he notes that his sleep is poor noting that he sleeps 4 to 5 hours nightly.  He reports his appetite is somewhat reduced and notes that since his  last visit he lost 28 pounds.  Today he denies SI/HI/VAH, mania, or paranoia.    She reports that the above exacerbates his anxiety and depression.  Recently he notes that he has been having increased crying spells.  Provider conducted a GAD-7 and patient scored a 17.  Provider also conducted PHQ-9 and patient scored a 13.  Today mirtazapine 15 mg increased to 30 mg to help anxiety, depression, and sleep.  He will continue all other medications as prescribed and follow-up with outpatient counseling for therapy.  No other concerns noted at this time.       Visit Diagnosis:    ICD-10-CM   1. Moderate episode of recurrent major depressive disorder (HCC)  F33.1 hydrOXYzine (ATARAX) 50 MG tablet    mirtazapine (REMERON) 30 MG tablet    PARoxetine (PAXIL) 40 MG tablet    2. Bipolar I disorder, most recent episode depressed (HCC)  F31.30 QUEtiapine (SEROQUEL) 400 MG tablet       Past Psychiatric History:  bipolar disorder, PTSD, explosive disorder, depression, and cannabis use disorder  Past Medical History:  Past Medical History:  Diagnosis Date   Anorexia    Atrial fib/flutter, transient    Back pain    Bipolar 1 disorder (HCC)    Bipolar disorder (HCC)    Bradycardia    Chest pain    Coronary artery disease    Hyperthyroidism    Palpitations    Seizures (HCC)    Tachyarrhythmia    History reviewed. No pertinent surgical history.  Family Psychiatric History:  PTSD,  Depression, Bipolar disorder,   Family History:  Family History  Problem Relation Age of Onset   Heart disease Mother    Diabetes Mother    Heart failure Mother    Heart disease Father    Heart attack Father     Social History:  Social History   Socioeconomic History   Marital status: Married    Spouse name: Not on file   Number of children: Not on file   Years of education: Not on file   Highest education level: Not on file  Occupational History   Not on file  Tobacco Use   Smoking status: Every  Day    Packs/day: 0.25    Types: Cigarettes   Smokeless tobacco: Current  Substance and Sexual Activity   Alcohol use: No   Drug use: Yes    Types: Marijuana   Sexual activity: Yes  Other Topics Concern   Not on file  Social History Narrative   Currently working to get medicaid.    Social Determinants of Health   Financial Resource Strain: Not on file  Food Insecurity: Not on file  Transportation Needs: Not on file  Physical Activity: Not on file  Stress: Not on file  Social Connections: Not on file    Allergies:  Allergies  Allergen Reactions   Penicillins Hives    Metabolic Disorder Labs: No results found for: HGBA1C, MPG No results found for: PROLACTIN No results found for: CHOL, TRIG, HDL, CHOLHDL, VLDL, LDLCALC Lab Results  Component Value Date   TSH 0.381 02/06/2013   TSH 0.376 03/21/2011    Therapeutic Level Labs: No results found for: LITHIUM Lab Results  Component Value Date   VALPROATE <10.0 (L) 12/22/2014   VALPROATE <10.0 (L) 09/11/2010   No components found for:  CBMZ  Current Medications: Current Outpatient Medications  Medication Sig Dispense Refill   chlorhexidine (PERIDEX) 0.12 % solution Use as directed 15 mLs in the mouth or throat 2 (two) times daily. Swish and spit.  Do not swallow. (Patient not taking: Reported on 06/18/2020) 120 mL 0   fluconazole (DIFLUCAN) 200 MG tablet Take 1 tablet (200 mg total) by mouth daily. (Patient not taking: Reported on 06/18/2020) 7 tablet 0   hydrOXYzine (ATARAX) 50 MG tablet Take 1 tablet (50 mg total) by mouth 3 (three) times daily as needed for anxiety (may take 2 tabs at bedtime). 90 tablet 3   mirtazapine (REMERON) 30 MG tablet Take 1 tablet (30 mg total) by mouth at bedtime. 30 tablet 3   PARoxetine (PAXIL) 40 MG tablet TAKE 1 TABLET (40 MG TOTAL) BY MOUTH DAILY. 30 tablet 3   QUEtiapine (SEROQUEL) 400 MG tablet TAKE 1 TABLET (400 MG TOTAL) BY MOUTH AT BEDTIME. 30 tablet 3   No current  facility-administered medications for this visit.     Musculoskeletal: Strength & Muscle Tone:  Unable to assess due to telephone visit Gait & Station:  Unable to assess due to telephone visit Patient leans: N/A  Psychiatric Specialty Exam: Review of Systems  There were no vitals taken for this visit.There is no height or weight on file to calculate BMI.  General Appearance:  Unable to assess due to telephone visit  Eye Contact:   Unable to assess due to telephone visit  Speech:  Clear and Coherent and Normal Rate  Volume:  Normal  Mood:  Anxious and Depressed  Affect:  Appropriate and Congruent  Thought Process:  Coherent, Goal Directed and Linear  Orientation:  Full (Time, Place, and Person)  Thought Content: WDL and Logical   Suicidal Thoughts:  No  Homicidal Thoughts:  No  Memory:  Immediate;   Good Recent;   Good Remote;   Good  Judgement:  Good  Insight:  Good  Psychomotor Activity:  Normal  Concentration:  Concentration: Good and Attention Span: Good  Recall:  Good  Fund of Knowledge: Good  Language: Good  Akathisia:  No  Handed:  Left  AIMS (if indicated): Not done  Assets:  Communication Skills Desire for Improvement Financial Resources/Insurance Housing Intimacy Leisure Time Social Support  ADL's:  Intact  Cognition: WNL  Sleep:  Fair   Screenings: GAD-7    Flowsheet Row Office Visit from 02/14/2022 in [facility] Video Visit from 02/22/2021 in [facility] Video Visit from 11[facility]26/2020 in [facility]  Total GAD-7 Score 17 15 20       PHQ2-9    Flowsheet Row Office Visit from 02/14/2022 in [facility] Video Visit from 02/22/2021 in [facility] Video Visit from 11/26/2020 in [facility] Counselor from 05/26/2020 in [facility]  PHQ-2 Total Score 3 4 6 2   PHQ-9  Total Score 13 12 20 5       Flowsheet Row Video Visit from 11/26/2020 in [facility] ED from 06/17/2020 in [facility] EMERGENCY DEPARTMENT  C-SSRS RISK CATEGORY No Risk Error: Q3, 4, or 5 should not be populated when Q2 is No        Assessment and Plan: Patient notes that his anxiety and depression has since her last visit. Today mirtazapine 15 mg increased to 30 mg to help anxiety, depression, and sleep.  He will continue all other medications as prescribed  1. Moderate episode of recurrent major depressive disorder (HCC)  Increased- mirtazapine (REMERON) 30 MG tablet; Take 1 tablet (30 mg total) by mouth at bedtime.  Dispense: 30 tablet; Refill: 3 Continue- PARoxetine (PAXIL) 40 MG tablet; Take 1 tablets (40 mg total) by mouth daily.  Dispense: 30 tablet; Refill: 3 Continue- hydrOXYzine (ATARAX/VISTARIL) 05 MG tablet; Take 1 tablet (50 mg total) by mouth 3 (three) times daily as needed.  Dispense: 30 tablet; Refill: 3  2. Bipolar I disorder, most recent episode depressed (HCC)  Continue- QUEtiapine (SEROQUEL) 400 MG tablet; Take 1 tablet (400 mg total) by mouth at bedtime.  Dispense: 30 tablet; Refill: 3    Follow up in 3 months Follow up with outpatient therapist.   [facility], NP 02/14/2022, 10:41 AM

## 2022-02-15 ENCOUNTER — Other Ambulatory Visit: Payer: Self-pay

## 2022-02-16 ENCOUNTER — Ambulatory Visit (INDEPENDENT_AMBULATORY_CARE_PROVIDER_SITE_OTHER): Payer: No Payment, Other | Admitting: Clinical

## 2022-02-16 DIAGNOSIS — F3130 Bipolar disorder, current episode depressed, mild or moderate severity, unspecified: Secondary | ICD-10-CM | POA: Diagnosis not present

## 2022-02-16 NOTE — Progress Notes (Signed)
THERAPIST PROGRESS NOTE Virtual Visit via Telephone Note  I connected with Kenneth Jimenez on 02/16/22 at 11:00 AM EDT by telephone and verified that I am speaking with the correct person using two identifiers.  Location: Patient: home Provider: office   I discussed the limitations, risks, security and privacy concerns of performing an evaluation and management service by telephone and the availability of in person appointments. I also discussed with the patient that there may be a patient responsible charge related to this service. The patient expressed understanding and agreed to proceed.   Follow Up Instructions: I discussed the assessment and treatment plan with the patient. The patient was provided an opportunity to ask questions and all were answered. The patient agreed with the plan and demonstrated an understanding of the instructions.   The patient was advised to call back or seek an in-person evaluation if the symptoms worsen or if the condition fails to improve as anticipated.   Session Time: 30 minutes  Participation Level: Active  Behavioral Response: NAAlertEuthymic  Type of Therapy: Individual Therapy  Treatment Goals addressed: Client will identify negative behaviors that interfere with improved interpersonal and social interaction 1x per session.  ProgressTowards Goals: Progressing  Interventions: CBT and Supportive  Summary:  [name] is a 56 y.o. male who presents for the scheduled session oriented times five and cooperative. Client denied hallucinations and delusions Client reported on today he is doing fairly well. Client reported since his last appointment he met with his psychiatrist to have his medication adjusted. Client reported otherwise his daughter has been with him since last Thursday. Client reported they are getting along well. Client reported his daughter does not know that her mother did not want to keep her. Client reported he is working  on staying well for her as well because he knows as time goes on she will tell him things about her time living with her mom and it will be hard to listen to. Client reported he is still thinking about how he wants to move forward from his previous relationship. Client reported his ex girlfriend has reached out to him and acted in a way that she wants to maintain communication with him  after going through a court process. Client reported he has filed for disability and the process is going well for him. Evidence of progress towards goal:  client reported he is using 1 skill of maintaining boundaries 7 days out of the week. Client reported practicing 2 calming techniques.    Suicidal/Homicidal: Nowithout intent/plan  Therapist Response:  Therapist began the appointment asking the client how he has been doing since last seen. Therapist used CBT to engage using active listening and positive emotional support. Therapist used CBT to engage with the client to ask open ended questions about his adjustment to changes of custody for his daughter. Therapist used CBT to ask the client to describe other social and/ or interpersonal stressors that affect his mood. Therapist used CBT to reinforce boundary building skills. Therapist used CBT ask the client to identify his progress with frequency of use with coping skills with continued practice in his daily activity.  Therapist used CBT to address questions and concerns. Client was scheduled for next appointment.     Plan: Return again in 5 weeks.  Diagnosis: bipolar 1 disorder, most recent episode depressed  Collaboration of Care: Patient refused AEB none requested by the client.  Patient/Guardian was advised Release of Information must be obtained prior to any record release  in order to collaborate their care with an outside provider. Patient/Guardian was advised if they have not already done so to contact the registration department to sign all necessary  forms in order for [place] to release information regarding their care.   Consent: Patient/Guardian gives verbal consent for treatment and assignment of benefits for services provided during this visit. Patient/Guardian expressed understanding and agreed to proceed.   [place], LCSW 02/16/2022

## 2022-02-21 ENCOUNTER — Other Ambulatory Visit: Payer: Self-pay

## 2022-02-22 ENCOUNTER — Other Ambulatory Visit: Payer: Self-pay

## 2022-03-14 NOTE — Plan of Care (Signed)
  Problem: Bipolar Disorder CCP Problem  1  Goal: STG: Jakaiden WILL IDENTIFY NEGATIVE BEHAVIORS THAT INTERFERE WITH IMPROVED INTERPERSONAL AND SOCIAL INTERACTION ONCE PER SESSION Outcome: Progressing Goal: STG: Jacen WILL ATTEND AT LEAST 80% OF SCHEDULED FOLLOW-UP MEDICATION MANAGEMENT APPOINTMENTS Outcome: Progressing   

## 2022-04-05 ENCOUNTER — Ambulatory Visit (INDEPENDENT_AMBULATORY_CARE_PROVIDER_SITE_OTHER): Payer: No Payment, Other | Admitting: Clinical

## 2022-04-05 DIAGNOSIS — F3130 Bipolar disorder, current episode depressed, mild or moderate severity, unspecified: Secondary | ICD-10-CM | POA: Diagnosis not present

## 2022-04-09 NOTE — Plan of Care (Signed)
  Problem: Bipolar Disorder CCP Problem  1  Goal: STG: Kenneth Jimenez WILL IDENTIFY NEGATIVE BEHAVIORS THAT INTERFERE WITH IMPROVED INTERPERSONAL AND SOCIAL INTERACTION ONCE PER SESSION Outcome: Progressing Goal: STG: Kenneth Jimenez WILL ATTEND AT LEAST 80% OF SCHEDULED FOLLOW-UP MEDICATION MANAGEMENT APPOINTMENTS Outcome: Progressing   

## 2022-04-09 NOTE — Progress Notes (Signed)
THERAPIST PROGRESS NOTE Virtual Visit via Video Note  I connected with Kenneth Jimenez on 04/05/2022 at  3:00 PM EDT by a video enabled telemedicine application and verified that I am speaking with the correct person using two identifiers.  Location: Patient: home Provider: office   I discussed the limitations of evaluation and management by telemedicine and the availability of in person appointments. The patient expressed understanding and agreed to proceed.   Follow Up Instructions: I discussed the assessment and treatment plan with the patient. The patient was provided an opportunity to ask questions and all were answered. The patient agreed with the plan and demonstrated an understanding of the instructions.   The patient was advised to call back or seek an in-person evaluation if the symptoms worsen or if the condition fails to improve as anticipated.   Session Time: 40 minutes  Participation Level: Active  Behavioral Response: CasualAlertEuthymic  Type of Therapy: Individual Therapy  Treatment Goals addressed: client will identify negative behaviors that interfere with improved interpersonal and social interaction 1x per session   ProgressTowards Goals: Progressing  Interventions: CBT  Summary:  Kenneth Jimenez is a 56 y.o. male who presents for the scheduled session oriented times five and cooperative. Client denied hallucinations and delusions. Client reported he has been feeling irritable. Client reported he was recently in jail 2 weeks ago. Client reported his ex called the police and made a false allegation which led to him being arrested. Client reported he was released on the second day making bond. Client reported once he was released his charges were dropped. Client reported ongoing court dates regarding the restraining order his ex out against him. Client reported he has evidence that she attempted to reach out to him and was seen publicly with him which contradicts  her allegations of feeling threatened by him. Client reported the stress over time had caused him to lose weight. Client reported he knows he has problems with anger but he never put her in danger. Client reported he does get mad and he does not want to hear what anyone has to say which he is working on. Client reported otherwise he and his daughter are doing well at home together. Evidence of progress towards goal:  client reported he is not using the sleeping pill nightly, maybe 4 out of 7 days per week. Client reported he is sleeping better having made adjustments to reducing stressors in his life.    Suicidal/Homicidal: Nowithout intent/plan  Therapist Response:  Therapist began the appointment asking the client how he has been doing since last seen. Therapist used CBT to engage using active listening and positive emotional support. Therapist used CBT to engage and ask the client to describe recent stressors that negatively impacted his mood. Therapist used CBT to engage with the client about making healthy decisions regarding his interpersonal relationships. Therapist used CBT to engage and discuss healthy ways to communicate anger. Therapist used CBT ask the client to identify her progress with frequency of use with coping skills with continued practice in her daily activity.    Therapist assigned the client homework to work on positive conflict resolution and communication skills.    Plan: Return again in 5 weeks.  Diagnosis: bipolar 1 disorder, most recent episode depressed  Collaboration of Care: Patient refused AEB none requested.  Patient/Guardian was advised Release of Information must be obtained prior to any record release in order to collaborate their care with an outside provider. Patient/Guardian was advised if they have  not already done so to contact the registration department to sign all necessary forms in order for [place] to release information regarding their care.    Consent: Patient/Guardian gives verbal consent for treatment and assignment of benefits for services provided during this visit. Patient/Guardian expressed understanding and agreed to proceed.   Neena Rhymes Delano Scardino, LCSW 04/05/2022

## 2022-04-19 ENCOUNTER — Telehealth (HOSPITAL_COMMUNITY): Payer: Self-pay | Admitting: Psychiatry

## 2022-04-19 NOTE — Telephone Encounter (Signed)
Provider called Ms. Leavy Cella. She notes that Mr. Kenneth Jimenez will be getting custody of his daughter and she is required by the court to check in on his medication compliant.  Patient has been attending medication management sessions as well as his therapy session and has reported compliance.  Ms. Leavy Cella was notified of this.  No other concerns at this time

## 2022-04-28 ENCOUNTER — Ambulatory Visit (INDEPENDENT_AMBULATORY_CARE_PROVIDER_SITE_OTHER): Payer: No Payment, Other | Admitting: Clinical

## 2022-04-28 DIAGNOSIS — F3130 Bipolar disorder, current episode depressed, mild or moderate severity, unspecified: Secondary | ICD-10-CM | POA: Diagnosis not present

## 2022-04-29 NOTE — Progress Notes (Unsigned)
THERAPIST PROGRESS NOTE Virtual Visit via Telephone Note  I connected with Kenneth Jimenez on 04/29/22 at  1:00 PM EDT by telephone and verified that I am speaking with the correct person using two identifiers.  Location: Patient: outside Provider: office   I discussed the limitations, risks, security and privacy concerns of performing an evaluation and management service by telephone and the availability of in person appointments. I also discussed with the patient that there may be a patient responsible charge related to this service. The patient expressed understanding and agreed to proceed.   Follow Up Instructions: I discussed the assessment and treatment plan with the patient. The patient was provided an opportunity to ask questions and all were answered. The patient agreed with the plan and demonstrated an understanding of the instructions.   The patient was advised to call back or seek an in-person evaluation if the symptoms worsen or if the condition fails to improve as anticipated.    Session Time: 20 minutes  Participation Level: Active  Behavioral Response: NAAlertEuthymic  Type of Therapy: Individual Therapy  Treatment Goals addressed: client will idnentify negative behaviors that interfere with improved interpersonal and social interaction 1x per session  ProgressTowards Goals: Progressing  Interventions: CBT and Supportive  Summary:  Kenneth Jimenez is a 56 y.o. male who presents for the scheduled appointment oriented times five and cooperative. Client denied hallucinations and delusions. Client reported on today he is doing very well. Client reported he was currently outside and not close to home. Client reported he is happy he got full custody of his daughter now. Client reported his trial custody with her over the past two months has gone very well. Client reported he has not had any issues or worries about her being at home with her. Client reported his daughter  has her days but its nothing that has not been unmanageable. Client reported he has been in a good emotional space otherwise and has not given much thought to his ongoing court issues with his ex girlfriend. Client reported he does not like to bring up her name. Client reported he tries not to focus on it. Client reported having his daughter around has made him more mindful of how he responds to situations because hi daughter is also subject to seeing how he carries himself. Client reported he is medication compliant. Client reported he still has depressive symptoms but it is no where near as bad as it was. Client reported his appetite is coming back and can tell he is putting back on weight.  Evidence of progress towards goal:  client reported he is medication compliant 7 out of 7 days per week. Client reported he mediates with his anger by doing 1 technique of taking a time out and thinking before he speaks and reacts.   Suicidal/Homicidal: Nowithout intent/plan  Therapist Response:  Therapist began the appointment asking the client how he has been doing. Therapist used CBT to engage using active listening and positive emotional support. Therapist used CBT to engage and ask the client about how he is adjusting to changes in his home and family life. Therapist used CBT to engage and ask about his ability to maintain stressors. Therapist used CBT to engage to ask the client about triggering situations for anger and how he processes to respond to them. Therapist used CBT ask the client to identify his progress with frequency of use with coping skills with continued practice in his daily activity.    Therapist assigned the  client homework to practice self care, boundaries and anger management techniques.    Plan: Return again in 3 weeks.  Diagnosis: bipolar 1 most recent episode depressed  Collaboration of Care: Other client requested the therapist speak with his psychiatrist about writing a  letter for disability regarding inability to work.  Patient/Guardian was advised Release of Information must be obtained prior to any record release in order to collaborate their care with an outside provider. Patient/Guardian was advised if they have not already done so to contact the registration department to sign all necessary forms in order for [place] to release information regarding their care.   Consent: Patient/Guardian gives verbal consent for treatment and assignment of benefits for services provided during this visit. Patient/Guardian expressed understanding and agreed to proceed.   Kenneth Rhymes Freya Zobrist, LCSW 04/28/2022

## 2022-04-30 NOTE — Plan of Care (Signed)
  Problem: Bipolar Disorder CCP Problem  1  Goal: STG: Kenneth Jimenez WILL IDENTIFY NEGATIVE BEHAVIORS THAT INTERFERE WITH IMPROVED INTERPERSONAL AND SOCIAL INTERACTION ONCE PER SESSION Outcome: Progressing Goal: STG: Kenneth Jimenez WILL ATTEND AT LEAST 80% OF SCHEDULED FOLLOW-UP MEDICATION MANAGEMENT APPOINTMENTS Outcome: Progressing   

## 2022-05-17 ENCOUNTER — Other Ambulatory Visit: Payer: Self-pay

## 2022-05-17 ENCOUNTER — Telehealth (INDEPENDENT_AMBULATORY_CARE_PROVIDER_SITE_OTHER): Payer: No Payment, Other | Admitting: Psychiatry

## 2022-05-17 ENCOUNTER — Encounter (HOSPITAL_COMMUNITY): Payer: Self-pay | Admitting: Psychiatry

## 2022-05-17 DIAGNOSIS — F3130 Bipolar disorder, current episode depressed, mild or moderate severity, unspecified: Secondary | ICD-10-CM | POA: Diagnosis not present

## 2022-05-17 DIAGNOSIS — F331 Major depressive disorder, recurrent, moderate: Secondary | ICD-10-CM

## 2022-05-17 MED ORDER — PAROXETINE HCL 40 MG PO TABS
ORAL_TABLET | Freq: Every day | ORAL | 3 refills | Status: DC
  Filled 2022-05-17: qty 30, 30d supply, fill #0

## 2022-05-17 MED ORDER — MIRTAZAPINE 30 MG PO TABS
30.0000 mg | ORAL_TABLET | Freq: Every day | ORAL | 3 refills | Status: DC
  Filled 2022-05-17: qty 30, 30d supply, fill #0

## 2022-05-17 MED ORDER — HYDROXYZINE HCL 50 MG PO TABS
50.0000 mg | ORAL_TABLET | Freq: Three times a day (TID) | ORAL | 3 refills | Status: DC | PRN
  Filled 2022-05-17: qty 90, 30d supply, fill #0
  Filled 2022-05-17: qty 90, 23d supply, fill #0

## 2022-05-17 MED ORDER — QUETIAPINE FUMARATE 400 MG PO TABS
ORAL_TABLET | ORAL | 3 refills | Status: DC
  Filled 2022-05-17: qty 30, 30d supply, fill #0
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
## 2022-05-24 ENCOUNTER — Other Ambulatory Visit: Payer: Self-pay

## 2022-06-08 ENCOUNTER — Ambulatory Visit (INDEPENDENT_AMBULATORY_CARE_PROVIDER_SITE_OTHER): Payer: No Payment, Other | Admitting: Clinical

## 2022-06-08 DIAGNOSIS — F3130 Bipolar disorder, current episode depressed, mild or moderate severity, unspecified: Secondary | ICD-10-CM | POA: Diagnosis not present

## 2022-06-08 NOTE — Progress Notes (Signed)
THERAPIST PROGRESS NOTE Virtual Visit via Telephone Note  I connected with Kenneth Jimenez on 06/08/22 at 11:00 AM EDT by telephone and verified that I am speaking with the correct person using two identifiers.  Location: Patient: home Provider: office   I discussed the limitations, risks, security and privacy concerns of performing an evaluation and management service by telephone and the availability of in person appointments. I also discussed with the patient that there may be a patient responsible charge related to this service. The patient expressed understanding and agreed to proceed.   Follow Up Instructions: I discussed the assessment and treatment plan with the patient. The patient was provided an opportunity to ask questions and all were answered. The patient agreed with the plan and demonstrated an understanding of the instructions.   The patient was advised to call back or seek an in-person evaluation if the symptoms worsen or if the condition fails to improve as anticipated.   Session Time: 30 minutes  Participation Level: Active  Behavioral Response: NAAlertEuthymic  Type of Therapy: Individual Therapy  Treatment Goals addressed: client will identify negative behaviors that interfere with improved interpersonal and social interaction 1x per session  ProgressTowards Goals: Progressing  Interventions: CBT and Supportive  Summary:  Kenneth Jimenez is a 56 y.o. male who presents to the scheduled appointment oriented x5 and cooperative.  Client denied hallucinations and delusions. Client reported on today he is doing well and things have been going well for him and his daughter.  Client reported it would be coming up on 4 months that his daughter has been with him and she has adjusted well.  Client reported he goes with her to all of her appointments and she is doing well in school and in her therapy sessions.  Client reported she knows that she will have good and bad  days and he has been working on talking her through some negative behaviors such as not doing what she was told or for example making a mess and expecting him to clean it.  Client reported he makes appointment to periodically plan to do things during the week or on the weekends with her so she is not just in a routine of school and going home with nothing to look forward to.  Client reported he knows there are a lot of people who doubted that he will be able to care for his daughter independently. Client reported he will be going to court in October related to his ex girlfriend/fiance'. Client reported he has learned that he can't make someone love him but he is focused on being single.Client reported since the break up he is feeling less stressed and his appetite increasing.  Evidence of progress towards goal:  client reported he is medication compliant 7 days out of the week. Client reported he is reframing thoughts that justified previous negative behaviors to correct them.  Suicidal/Homicidal: Nowithout intent/plan  Therapist Response:  Therapist began the appointment asking the client how he has been doing since last seen. Therapist used CBT to engage using active listening and positive emotional support. Therapist used CBT to engage and asked the client how he is continuing to adjust to full-time parenting. Therapist used CBT to positively reinforce the clients flexibility and positive use of parenting skills. Therapist used CBT to ask the client open-ended questions about his thought process with upcoming legal issues and ending his previous relationship. Therapist used CBT to engage and reinforce positive use of communication skills and boundaries. Therapist  used CBT ask the client to identify his progress with frequency of use with coping skills with continued practice in his daily activity.    Therapist assigned the client homework to practice self care.   Plan: Return again in 3  weeks.  Diagnosis: bipolar 1 disorder, most recent episode depressed  Collaboration of Care: Patient refused AEB none requested by the client at this time.  Patient/Guardian was advised Release of Information must be obtained prior to any record release in order to collaborate their care with an outside provider. Patient/Guardian was advised if they have not already done so to contact the registration department to sign all necessary forms in order for [place] to release information regarding their care.   Consent: Patient/Guardian gives verbal consent for treatment and assignment of benefits for services provided during this visit. Patient/Guardian expressed understanding and agreed to proceed.   [place], LCSW 06/08/2022

## 2022-06-10 NOTE — Plan of Care (Signed)
  Problem: Bipolar Disorder CCP Problem  1  Goal: STG: Kenneth Jimenez WILL IDENTIFY NEGATIVE BEHAVIORS THAT INTERFERE WITH IMPROVED INTERPERSONAL AND SOCIAL INTERACTION ONCE PER SESSION Outcome: Progressing Goal: STG: Kenneth Jimenez WILL ATTEND AT LEAST 80% OF SCHEDULED FOLLOW-UP MEDICATION MANAGEMENT APPOINTMENTS Outcome: Progressing

## 2022-06-22 ENCOUNTER — Ambulatory Visit (INDEPENDENT_AMBULATORY_CARE_PROVIDER_SITE_OTHER): Payer: No Payment, Other | Admitting: Clinical

## 2022-06-22 DIAGNOSIS — F3130 Bipolar disorder, current episode depressed, mild or moderate severity, unspecified: Secondary | ICD-10-CM | POA: Diagnosis not present
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
## 2022-06-24 NOTE — Plan of Care (Signed)
  Problem: Bipolar Disorder CCP Problem  1  Goal: STG: Kenneth Jimenez WILL IDENTIFY NEGATIVE BEHAVIORS THAT INTERFERE WITH IMPROVED INTERPERSONAL AND SOCIAL INTERACTION ONCE PER SESSION Outcome: Progressing Goal: STG: Kenneth Jimenez WILL ATTEND AT LEAST 80% OF SCHEDULED FOLLOW-UP MEDICATION MANAGEMENT APPOINTMENTS Outcome: Progressing   

## 2022-07-06 ENCOUNTER — Ambulatory Visit (HOSPITAL_COMMUNITY): Payer: No Payment, Other | Admitting: Clinical

## 2022-08-01 ENCOUNTER — Ambulatory Visit (INDEPENDENT_AMBULATORY_CARE_PROVIDER_SITE_OTHER): Payer: No Payment, Other | Admitting: Clinical

## 2022-08-01 DIAGNOSIS — F3130 Bipolar disorder, current episode depressed, mild or moderate severity, unspecified: Secondary | ICD-10-CM | POA: Diagnosis not present

## 2022-08-01 NOTE — Progress Notes (Signed)
THERAPIST PROGRESS NOTE Virtual Visit via Telephone Note  I connected with Kenneth Jimenez on 08/01/22 at  2:00 PM EST by telephone and verified that I am speaking with the correct person using two identifiers.  Location: Patient: home Provider: office   I discussed the limitations, risks, security and privacy concerns of performing an evaluation and management service by telephone and the availability of in person appointments. I also discussed with the patient that there may be a patient responsible charge related to this service. The patient expressed understanding and agreed to proceed.   Follow Up Instructions: I discussed the assessment and treatment plan with the patient. The patient was provided an opportunity to ask questions and all were answered. The patient agreed with the plan and demonstrated an understanding of the instructions.   The patient was advised to call back or seek an in-person evaluation if the symptoms worsen or if the condition fails to improve as anticipated.   Session Time: 25 minutes  Participation Level: Active  Behavioral Response: NAAlertEuthymic  Type of Therapy: Individual Therapy  Treatment Goals addressed: Client will identify negative behaviors that interfere with improved interpersonal social interaction at least 1x per session  ProgressTowards Goals: Progressing  Interventions: CBT and Supportive  Summary:  Kenneth Jimenez is a 56 y.o. male who presents for the scheduled appointment oriented times five, appropriately dressed, and friendly. Client denied hallucinations and delusions. Client reported on today he is doing well. Client reported he has not been too stressed. Client reported he had a few episodes with his daughters behaviors but things have improved. Client reported he talked with her doctor to also adjust her medication which has been going well. Client reported he was supposed to go to court recently but it was rescheduled.  Client reported feeling upset that he still has to go through this process because his ex fiance wanted to have him taken to jail. Client reported he is working with his lawyer now. Client recounted his previous issues in the relationship of discovering she was still talking to men that she dated before him. Client reported he walked away from her to prevent himself from taking anger out on her. Client reported he has learned to put distance between himself and people who try to get him out of character. Client reported otherwise he is looking forward to spending thanksgiving with his daughter and he is preparing to cook. Evidence of progress towards goal:  client is medication compliant 7 days per week. Client reported engaging in anger management techniques such as considering cons of his actions at least 1x per week or as stressors occur.  Suicidal/Homicidal: Nowithout intent/plan  Therapist Response:  Therapist began the appointment asking the client how he has been doing since last seen. Therapist used CBT to engage using active listening and positive emotional support. Therapist used CBT to engage and ask the client open ended questions about his adjustments with parenting and creating a schedule. Therapist used CBT to give the client time to discuss his thoughts and emotions relating to legal issues and interpersonal relationships. Therapist used CBT to reinforce the use of anger management skills and reframing thoughts. Therapist used CBT ask the client to identify his progress with frequency of use with coping skills with continued practice in his daily activity.    Therapist assigned the client homework to practice self care.   Plan: Return again in 3 weeks.  Diagnosis: bipolar 1 disorder, most recent episode depressed  Collaboration of Care: Patient  refused AEB none requested by the client.  Patient/Guardian was advised Release of Information must be obtained prior to any record  release in order to collaborate their care with an outside provider. Patient/Guardian was advised if they have not already done so to contact the registration department to sign all necessary forms in order for [place] to release information regarding their care.   Consent: Patient/Guardian gives verbal consent for treatment and assignment of benefits for services provided during this visit. Patient/Guardian expressed understanding and agreed to proceed.   Neena Rhymes Fatimah Sundquist, LCSW 08/01/2022

## 2022-08-01 NOTE — Plan of Care (Signed)
  Problem: Bipolar Disorder CCP Problem  1  Goal: STG: [name] WILL IDENTIFY NEGATIVE BEHAVIORS THAT INTERFERE WITH IMPROVED INTERPERSONAL AND SOCIAL INTERACTION ONCE PER SESSION Outcome: Progressing Goal: STG: [name] WILL ATTEND AT LEAST 80% OF SCHEDULED FOLLOW-UP MEDICATION MANAGEMENT APPOINTMENTS Outcome: Progressing

## 2022-08-10 ENCOUNTER — Telehealth (INDEPENDENT_AMBULATORY_CARE_PROVIDER_SITE_OTHER): Payer: No Payment, Other | Admitting: Psychiatry

## 2022-08-10 ENCOUNTER — Other Ambulatory Visit: Payer: Self-pay

## 2022-08-10 ENCOUNTER — Encounter (HOSPITAL_COMMUNITY): Payer: Self-pay | Admitting: Psychiatry

## 2022-08-10 DIAGNOSIS — F331 Major depressive disorder, recurrent, moderate: Secondary | ICD-10-CM

## 2022-08-10 DIAGNOSIS — F3130 Bipolar disorder, current episode depressed, mild or moderate severity, unspecified: Secondary | ICD-10-CM

## 2022-08-10 MED ORDER — QUETIAPINE FUMARATE 400 MG PO TABS
400.0000 mg | ORAL_TABLET | Freq: Every day | ORAL | 3 refills | Status: DC
  Filled 2022-08-10: qty 30, 30d supply, fill #0

## 2022-08-10 MED ORDER — HYDROXYZINE HCL 50 MG PO TABS
50.0000 mg | ORAL_TABLET | Freq: Three times a day (TID) | ORAL | 3 refills | Status: DC | PRN
  Filled 2022-08-10: qty 90, 30d supply, fill #0

## 2022-08-10 MED ORDER — PAROXETINE HCL 40 MG PO TABS
40.0000 mg | ORAL_TABLET | Freq: Every day | ORAL | 3 refills | Status: DC
  Filled 2022-08-10: qty 30, 30d supply, fill #0

## 2022-08-10 MED ORDER — MIRTAZAPINE 30 MG PO TABS
30.0000 mg | ORAL_TABLET | Freq: Every day | ORAL | 3 refills | Status: DC
  Filled 2022-08-10: qty 30, 30d supply, fill #0

## 2022-08-10 NOTE — Progress Notes (Signed)
BH MD/PA/NP OP Progress Note Virtual Visit via Telephone Note  I connected with Kenneth Jimenez on 08/10/22 at  1:00 PM EST by telephone and verified that I am speaking with the correct person using two identifiers.  Location: Patient: home Provider: Clinic   I discussed the limitations, risks, security and privacy concerns of performing an evaluation and management service by telephone and the availability of in person appointments. I also discussed with the patient that there may be a patient responsible charge related to this service. The patient expressed understanding and agreed to proceed.   I provided 30 minutes of non-face-to-face time during this encounter.      08/10/2022 12:49 PM Kenneth Jimenez  MRN:  [id number]  Chief Complaint: "I am doing well"   HPI: 57 year old male seen today for follow up psychiatric evaluation.   He has a psychiatric history of bipolar disorder, PTSD, explosive disorder, depression, and cannabis use disorder.  Patient is currently prescribed  mirtazapine 30 mg at bedtime,  hydroxyzine 50 mg 3 times daily as needed, Paxil 40 mg daily, and Seroquel 400 HS. Patient notes that his medications are effective in managing his psychiatric conditions.    Today patient unable to logon virtually so assessment was done over the phone.  During exam he was hyper verbal, pleasant, cooperative,and engaged in conversation. He informed provider that he is doing well. He notes that he spent the Thanksgiving holiday with his daughter [name]. He notes that he enjoyed the holiday with her but is saddened by his sibling not reaching out to him on the holidays. Other life stressors includes going to court on Dec 15th for violation of his [address] B. He notes his ex girlfriend also cheated on him with his friend. Despite these stressors he notes that he has been able to cope. He reports that he prays for his family and enemy's to cope. He endorses adequate sleep and appetite. Today he  denies SI/HI/VAH, mania, or paranoia.    Today patient requested not to do a GAD 7 or a PHQ 9. Theses assessments can be done at his next visit.   No medication changes made today. Patient agreeable to continue medications as prescribed.      Visit Diagnosis:    ICD-10-CM   1. Moderate episode of recurrent major depressive disorder (HCC)  F33.1 hydrOXYzine (ATARAX) 50 MG tablet    mirtazapine (REMERON) 30 MG tablet    PARoxetine (PAXIL) 40 MG tablet    2. Bipolar I disorder, most recent episode depressed (HCC)  F31.30 QUEtiapine (SEROQUEL) 400 MG tablet       Past Psychiatric History:  bipolar disorder, PTSD, explosive disorder, depression, and cannabis use disorder  Past Medical History:  Past Medical History:  Diagnosis Date   Anorexia    Atrial fib/flutter, transient    Back pain    Bipolar 1 disorder (HCC)    Bipolar disorder (HCC)    Bradycardia    Chest pain    Coronary artery disease    Hyperthyroidism    Palpitations    Seizures (HCC)    Tachyarrhythmia    No past surgical history on file.  Family Psychiatric History:  PTSD, Depression, Bipolar disorder,   Family History:  Family History  Problem Relation Age of Onset   Heart disease Mother    Diabetes Mother    Heart failure Mother    Heart disease Father    Heart attack Father     Social History:  Social History  Socioeconomic History   Marital status: Married    Spouse name: Not on file   Number of children: Not on file   Years of education: Not on file   Highest education level: Not on file  Occupational History   Not on file  Tobacco Use   Smoking status: Every Day    Packs/day: 0.25    Types: Cigarettes   Smokeless tobacco: Current  Substance and Sexual Activity   Alcohol use: No   Drug use: Yes    Types: Marijuana   Sexual activity: Yes  Other Topics Concern   Not on file  Social History Narrative   Currently working to get medicaid.    Social Determinants of Health    Financial Resource Strain: Not on file  Food Insecurity: Not on file  Transportation Needs: Not on file  Physical Activity: Not on file  Stress: Not on file  Social Connections: Not on file    Allergies:  Allergies  Allergen Reactions   Penicillins Hives    Metabolic Disorder Labs: No results found for: "HGBA1C", "MPG" No results found for: "PROLACTIN" No results found for: "CHOL", "TRIG", "HDL", "CHOLHDL", "VLDL", "LDLCALC" Lab Results  Component Value Date   TSH 0.381 02/06/2013   TSH 0.376 03/21/2011    Therapeutic Level Labs: No results found for: "LITHIUM" Lab Results  Component Value Date   VALPROATE <10.0 (L) 12/22/2014   VALPROATE <10.0 (L) 09/11/2010   No results found for: "CBMZ"  Current Medications: Current Outpatient Medications  Medication Sig Dispense Refill   chlorhexidine (PERIDEX) 0.12 % solution Use as directed 15 mLs in the mouth or throat 2 (two) times daily. Swish and spit.  Do not swallow. (Patient not taking: Reported on 06/18/2020) 120 mL 0   fluconazole (DIFLUCAN) 200 MG tablet Take 1 tablet (200 mg total) by mouth daily. (Patient not taking: Reported on 06/18/2020) 7 tablet 0   hydrOXYzine (ATARAX) 50 MG tablet Take 1 tablet (50 mg total) by mouth 3 (three) times daily as needed for anxiety (may take 2 tabs at bedtime). 90 tablet 3   mirtazapine (REMERON) 30 MG tablet Take 1 tablet (30 mg total) by mouth at bedtime. 30 tablet 3   PARoxetine (PAXIL) 40 MG tablet TAKE 1 TABLET (40 MG TOTAL) BY MOUTH DAILY. 30 tablet 3   QUEtiapine (SEROQUEL) 400 MG tablet TAKE 1 TABLET (400 MG TOTAL) BY MOUTH AT BEDTIME. 30 tablet 3   No current facility-administered medications for this visit.     Musculoskeletal: Strength & Muscle Tone:  Unable to assess due to telephone visit Gait & Station:  Unable to assess due to telephone visit Patient leans: N/A  Psychiatric Specialty Exam: Review of Systems  There were no vitals taken for this visit.There is no  height or weight on file to calculate BMI.  General Appearance:  Unable to assess due to telephone visit  Eye Contact:   Unable to assess due to telephone visit  Speech:  Clear and Coherent and Normal Rate  Volume:  Normal  Mood:  Euthymic  Affect:  Appropriate and Congruent  Thought Process:  Coherent, Goal Directed and Linear  Orientation:  Full (Time, Place, and Person)  Thought Content: WDL and Logical   Suicidal Thoughts:  No  Homicidal Thoughts:  No  Memory:  Immediate;   Good Recent;   Good Remote;   Good  Judgement:  Good  Insight:  Good  Psychomotor Activity:  Normal  Concentration:  Concentration: Good and Attention Span:  Good  Recall:  Good  Fund of Knowledge: Good  Language: Good  Akathisia:  No  Handed:  Left  AIMS (if indicated): Not done  Assets:  Communication Skills Desire for Improvement Financial Resources/Insurance Housing Intimacy Leisure Time Social Support  ADL's:  Intact  Cognition: WNL  Sleep:  Good   Screenings: GAD-7    Flowsheet Row Video Visit from 05/17/2022 in [facility] Office Visit from 02/14/2022 in [facility] Video Visit from 02/22/2021 in [facility] Video Visit from 11/26/2020 in [facility]  Total GAD-7 Score 5 17 15 20       PHQ2-9    Flowsheet Row Video Visit from 05/17/2022 in [facility] Office Visit from 02/14/2022 in [facility] Video Visit from 02/22/2021 in [facility] Video Visit from 11/26/2020 in [facility] Counselor from 05/26/2020 in [facility]  PHQ-2 Total Score 1 3 4 6 2   PHQ-9 Total Score 4 13 12 20 5       Flowsheet Row Video Visit from 11/26/2020 in [facility] ED from 06/17/2020 in [facility] EMERGENCY DEPARTMENT   C-SSRS RISK CATEGORY No Risk Error: Q3, 4, or 5 should not be populated when Q2 is No        Assessment and Plan: Patient notes that he is doing well on his current medication regiman. No medication changes made today. Patient agreeable to continue medications as prescribed.   1. Bipolar I disorder, most recent episode depressed (HCC)  Continue- QUEtiapine (SEROQUEL) 400 MG tablet; TAKE 1 TABLET (400 MG TOTAL) BY MOUTH AT BEDTIME.  Dispense: 30 tablet; Refill: 3  2. Moderate episode of recurrent major depressive disorder (HCC)  Continue- PARoxetine (PAXIL) 40 MG tablet; TAKE 1 TABLET (40 MG TOTAL) BY MOUTH DAILY.  Dispense: 30 tablet; Refill: 3 Continue- mirtazapine (REMERON) 30 MG tablet; Take 1 tablet (30 mg total) by mouth at bedtime.  Dispense: 30 tablet; Refill: 3 Continue- hydrOXYzine (ATARAX) 50 MG tablet; Take 1 tablet (50 mg total) by mouth 3 (three) times daily as needed for anxiety (may take 2 tabs at bedtime).  Dispense: 90 tablet; Refill: 3     Follow up in 3 months Follow up with outpatient therapist.   [facility], NP 08/10/2022, 12:49 PM

## 2022-08-17 ENCOUNTER — Other Ambulatory Visit: Payer: Self-pay

## 2022-09-09 ENCOUNTER — Ambulatory Visit (INDEPENDENT_AMBULATORY_CARE_PROVIDER_SITE_OTHER): Payer: No Payment, Other | Admitting: Clinical

## 2022-09-09 DIAGNOSIS — F3130 Bipolar disorder, current episode depressed, mild or moderate severity, unspecified: Secondary | ICD-10-CM | POA: Diagnosis not present

## 2022-09-12 NOTE — Progress Notes (Signed)
THERAPIST PROGRESS NOTE Virtual Visit via Video Note  I connected with Kenneth Jimenez on 09/09/2022 at 11:00 AM EST by a video enabled telemedicine application and verified that I am speaking with the correct person using two identifiers.  Location: Patient: home Provider: office   I discussed the limitations of evaluation and management by telemedicine and the availability of in person appointments. The patient expressed understanding and agreed to proceed.   Follow Up Instructions: I discussed the assessment and treatment plan with the patient. The patient was provided an opportunity to ask questions and all were answered. The patient agreed with the plan and demonstrated an understanding of the instructions.   The patient was advised to call back or seek an in-person evaluation if the symptoms worsen or if the condition fails to improve as anticipated.   Session Time: 30 minutes  Participation Level: Active  Behavioral Response: CasualAlertEuthymic  Type of Therapy: Individual Therapy  Treatment Goals addressed: client will identify 3 cognitive patterns and beliefs that support depression  ProgressTowards Goals: Progressing  Interventions: CBT and Supportive  Summary:  Kenneth Jimenez is a 57 y.o. male who presents for the scheduled appointment oriented times five, appropriately dressed, and friendly. Client denied hallucinations and delusions. Client reported on today he is doing well. Client reported he had a good christmas with his daughter at home. Client reported they did not travel. Client reported he had a good birthday as well. Client reported his family did not call for christmas or his birthday which was disappointing. Client reported he works hard to not focus on the people his friends and/or family that do not support him. Client reported being a full time parent has been going well. Client reported he has found his method of disciplining her appropriately and  learning how to help her work through negative emotions. Client reported he takes her regularly to hair appointments and makes sure she has nice clothes. Client reported he has been worried about his other daughter because she has been in the hospital critically ill. Client reported he found out through social media that she was in the hospital. Client reported the mother or any family did not care to reach out to him to give him updates. Client reported he has been calling the hospital on his own to see how she is doing. Client reported he still has ongoing court dates regarding his previous relationship but he is looking forward to that being done. Evidence of progress towards goal:  client reported reframing negative thoughts and behaviors at least 2x per week and as needed.   Suicidal/Homicidal: Nowithout intent/plan  Therapist Response:  Therapist began the appointment asking the client how he has bee doing. Therapist used CBT to engage using active listening and positive emotional support. Therapist used CBT to engage and ask the client how he is progressing with parenting and resolving challenges. Therapist used CBT to positive acknowledge and reinforce his use of problem solving and reframing his perspective to help him funtion daily. Therapist used CBT ask the client to identify his progress with frequency of use with coping skills with continued practice in his daily activity.    Therapist assigned the client homework to practice self care.   Plan: Return again in 3 weeks.  Diagnosis: bipolar 1 disorder, most recent episode, depressed  Collaboration of Care: Patient refused AEB none requested by the client.  Patient/Guardian was advised Release of Information must be obtained prior to any record release in order to collaborate  their care with an outside provider. Patient/Guardian was advised if they have not already done so to contact the registration department to sign all necessary  forms in order for [place] to release information regarding their care.   Consent: Patient/Guardian gives verbal consent for treatment and assignment of benefits for services provided during this visit. Patient/Guardian expressed understanding and agreed to proceed.   [place], LCSW 09/09/2022

## 2022-09-14 ENCOUNTER — Other Ambulatory Visit: Payer: Self-pay

## 2022-09-14 ENCOUNTER — Telehealth (INDEPENDENT_AMBULATORY_CARE_PROVIDER_SITE_OTHER): Payer: No Payment, Other | Admitting: Psychiatry

## 2022-09-14 ENCOUNTER — Encounter (HOSPITAL_COMMUNITY): Payer: Self-pay | Admitting: Psychiatry

## 2022-09-14 DIAGNOSIS — F3130 Bipolar disorder, current episode depressed, mild or moderate severity, unspecified: Secondary | ICD-10-CM | POA: Diagnosis not present

## 2022-09-14 DIAGNOSIS — F331 Major depressive disorder, recurrent, moderate: Secondary | ICD-10-CM

## 2022-09-14 MED ORDER — MIRTAZAPINE 30 MG PO TABS
30.0000 mg | ORAL_TABLET | Freq: Every day | ORAL | 3 refills | Status: DC
  Filled 2022-09-14: qty 30, 30d supply, fill #0

## 2022-09-14 MED ORDER — PAROXETINE HCL 40 MG PO TABS
40.0000 mg | ORAL_TABLET | Freq: Every day | ORAL | 3 refills | Status: DC
  Filled 2022-09-14: qty 30, 30d supply, fill #0

## 2022-09-14 MED ORDER — QUETIAPINE FUMARATE 400 MG PO TABS
400.0000 mg | ORAL_TABLET | Freq: Every day | ORAL | 3 refills | Status: DC
  Filled 2022-09-14: qty 30, 30d supply, fill #0

## 2022-09-14 MED ORDER — HYDROXYZINE HCL 50 MG PO TABS
50.0000 mg | ORAL_TABLET | Freq: Three times a day (TID) | ORAL | 3 refills | Status: DC | PRN
  Filled 2022-09-14: qty 90, 30d supply, fill #0

## 2022-09-14 NOTE — Progress Notes (Signed)
[facility] MD/PA/NP OP Progress Note Virtual Visit via Telephone Note  I connected with Kenneth Jimenez on 09/14/22 at 11:00 AM EST by telephone and verified that I am speaking with the correct person using two identifiers.  Location: Patient: home Provider: Clinic   I discussed the limitations, risks, security and privacy concerns of performing an evaluation and management service by telephone and the availability of in person appointments. I also discussed with the patient that there may be a patient responsible charge related to this service. The patient expressed understanding and agreed to proceed.   I provided 30 minutes of non-face-to-face time during this encounter.      09/14/2022 10:32 AM Kenneth Jimenez  MRN:  [id number]  Chief Complaint: "I am doing good"   HPI: 57 year old male seen today for follow up psychiatric evaluation.   He has a psychiatric history of bipolar disorder, PTSD, explosive disorder, depression, and cannabis use disorder.  Patient is currently prescribed  mirtazapine 30 mg at bedtime,  hydroxyzine 50 mg 3 times daily as needed, Paxil 40 mg daily, and Seroquel 400 HS. Patient notes that his medications are effective in managing his psychiatric conditions.    Today patient unable to logon virtually so assessment was done over the phone.  During exam he was pleasant, cooperative, and engaged in conversation. He reports that things are going well. He notes that he and his daughter Garwin Brothers enjoyed the Christmas Holiday and [place]. He notes that he was concerned about his older daughter who was hospitalized but is now doing well.  Patient informed Probation officer that his anxiety and depression are well-managed.  Today provider conducted a GAD-7 and patient scored a 7.  Provider also conducted PHQ-9 and patient scored a 12.  He endorses adequate sleep and appetite.  Today he denies SI/HI/VAH, mania, or paranoia.  Patient informed Probation officer that he has a tooth ache.  He notes that he plans  to follow-up with a dentist regarding this.  He does note that he takes Tylenol to help manage the pain.  No medication changes made today. Patient agreeable to continue medications as prescribed.      Visit Diagnosis:    ICD-10-CM   1. Bipolar I disorder, most recent episode depressed (HCC)  F31.30 QUEtiapine (SEROQUEL) 400 MG tablet    2. Moderate episode of recurrent major depressive disorder (HCC)  F33.1 PARoxetine (PAXIL) 40 MG tablet    mirtazapine (REMERON) 30 MG tablet    hydrOXYzine (ATARAX) 50 MG tablet       Past Psychiatric History:  bipolar disorder, PTSD, explosive disorder, depression, and cannabis use disorder  Past Medical History:  Past Medical History:  Diagnosis Date   Anorexia    Atrial fib/flutter, transient    Back pain    Bipolar 1 disorder (HCC)    Bipolar disorder (HCC)    Bradycardia    Chest pain    Coronary artery disease    Hyperthyroidism    Palpitations    Seizures ([place])    Tachyarrhythmia    History reviewed. No pertinent surgical history.  Family Psychiatric History:  PTSD, Depression, Bipolar disorder,   Family History:  Family History  Problem Relation Age of Onset   Heart disease Mother    Diabetes Mother    Heart failure Mother    Heart disease Father    Heart attack Father     Social History:  Social History   Socioeconomic History   Marital status: Married    Spouse name: Not  on file   Number of children: Not on file   Years of education: Not on file   Highest education level: Not on file  Occupational History   Not on file  Tobacco Use   Smoking status: Every Day    Packs/day: 0.25    Types: Cigarettes   Smokeless tobacco: Current  Substance and Sexual Activity   Alcohol use: No   Drug use: Yes    Types: Marijuana   Sexual activity: Yes  Other Topics Concern   Not on file  Social History Narrative   Currently working to get medicaid.    Social Determinants of Health   Financial Resource Strain: Not on  file  Food Insecurity: Not on file  Transportation Needs: Not on file  Physical Activity: Not on file  Stress: Not on file  Social Connections: Not on file    Allergies:  Allergies  Allergen Reactions   Penicillins Hives    Metabolic Disorder Labs: No results found for: "HGBA1C", "MPG" No results found for: "PROLACTIN" No results found for: "CHOL", "TRIG", "HDL", "CHOLHDL", "VLDL", "LDLCALC" Lab Results  Component Value Date   TSH 0.381 02/06/2013   TSH 0.376 03/21/2011    Therapeutic Level Labs: No results found for: "LITHIUM" Lab Results  Component Value Date   VALPROATE <10.0 (L) 12/22/2014   VALPROATE <10.0 (L) 09/11/2010   No results found for: "CBMZ"  Current Medications: Current Outpatient Medications  Medication Sig Dispense Refill   chlorhexidine (PERIDEX) 0.12 % solution Use as directed 15 mLs in the mouth or throat 2 (two) times daily. Swish and spit.  Do not swallow. (Patient not taking: Reported on 06/18/2020) 120 mL 0   fluconazole (DIFLUCAN) 200 MG tablet Take 1 tablet (200 mg total) by mouth daily. (Patient not taking: Reported on 06/18/2020) 7 tablet 0   hydrOXYzine (ATARAX) 50 MG tablet Take 1 tablet (50 mg total) by mouth 3 (three) times daily as needed for anxiety (may take 2 tabs at bedtime). 90 tablet 3   mirtazapine (REMERON) 30 MG tablet Take 1 tablet (30 mg total) by mouth at bedtime. 30 tablet 3   PARoxetine (PAXIL) 40 MG tablet Take 1 tablet (40 mg total) by mouth daily. 30 tablet 3   QUEtiapine (SEROQUEL) 400 MG tablet Take 1 tablet (400 mg total) by mouth at bedtime. 30 tablet 3   No current facility-administered medications for this visit.     Musculoskeletal: Strength & Muscle Tone:  Unable to assess due to telephone visit Gait & Station:  Unable to assess due to telephone visit Patient leans: N/A  Psychiatric Specialty Exam: Review of Systems  There were no vitals taken for this visit.There is no height or weight on file to calculate  BMI.  General Appearance:  Unable to assess due to telephone visit  Eye Contact:   Unable to assess due to telephone visit  Speech:  Clear and Coherent and Normal Rate  Volume:  Normal  Mood:  Euthymic  Affect:  Appropriate and Congruent  Thought Process:  Coherent, Goal Directed and Linear  Orientation:  Full (Time, Place, and Person)  Thought Content: WDL and Logical   Suicidal Thoughts:  No  Homicidal Thoughts:  No  Memory:  Immediate;   Good Recent;   Good Remote;   Good  Judgement:  Good  Insight:  Good  Psychomotor Activity:  Normal  Concentration:  Concentration: Good and Attention Span: Good  Recall:  Good  Fund of Knowledge: Good  Language: Good  Akathisia:  No  Handed:  Left  AIMS (if indicated): Not done  Assets:  Communication Skills Desire for Improvement Financial Resources/Insurance Housing Intimacy Leisure Time Social Support  ADL's:  Intact  Cognition: WNL  Sleep:  Good   Screenings: GAD-7    Flowsheet Row Video Visit from 09/14/2022 in [facility] Video Visit from 05/17/2022 in [facility] Office Visit from 02/14/2022 in [facility] Video Visit from 02/22/2021 in [facility] Video Visit from 11/26/2020 in [facility]  Total GAD-7 Score 7 5 17 15 20       PHQ2-9    Flowsheet Row Video Visit from 09/14/2022 in [facility] Video Visit from 05/17/2022 in [facility] Office Visit from 02/14/2022 in [facility] Video Visit from 02/22/2021 in [facility] Video Visit from 11/26/2020 in [facility]  PHQ-2 Total Score 2 1 3 4 6   PHQ-9 Total Score 12 4 13 12 20       Flowsheet Row Video Visit from 11/26/2020 in [facility] ED from 06/17/2020 in [place] No Risk Error: Q3, 4, or 5 should not be populated when Q2 is No        Assessment and Plan: Patient notes that he is doing well on his current medication regiman. No medication changes made today. Patient agreeable to continue medications as prescribed.   1. Bipolar I disorder, most recent episode depressed (HCC)  Continue- QUEtiapine (SEROQUEL) 400 MG tablet; TAKE 1 TABLET (400 MG TOTAL) BY MOUTH AT BEDTIME.  Dispense: 30 tablet; Refill: 3  2. Moderate episode of recurrent major depressive disorder (HCC)  Continue- PARoxetine (PAXIL) 40 MG tablet; TAKE 1 TABLET (40 MG TOTAL) BY MOUTH DAILY.  Dispense: 30 tablet; Refill: 3 Continue- mirtazapine (REMERON) 30 MG tablet; Take 1 tablet (30 mg total) by mouth at bedtime.  Dispense: 30 tablet; Refill: 3 Continue- hydrOXYzine (ATARAX) 50 MG tablet; Take 1 tablet (50 mg total) by mouth 3 (three) times daily as needed for anxiety (may take 2 tabs at bedtime).  Dispense: 90 tablet; Refill: 3     Follow up in 3 months Follow up with outpatient therapist.   Salley Slaughter, NP 09/14/2022, 10:32 AM

## 2022-09-20 ENCOUNTER — Other Ambulatory Visit: Payer: Self-pay

## 2022-11-01 ENCOUNTER — Ambulatory Visit (INDEPENDENT_AMBULATORY_CARE_PROVIDER_SITE_OTHER): Payer: Medicaid Other | Admitting: Clinical

## 2022-11-01 DIAGNOSIS — F3130 Bipolar disorder, current episode depressed, mild or moderate severity, unspecified: Secondary | ICD-10-CM
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
## 2022-11-09 ENCOUNTER — Telehealth (HOSPITAL_COMMUNITY): Payer: Medicaid Other | Admitting: Psychiatry

## 2022-11-09 NOTE — Progress Notes (Unsigned)
[facility] MD/PA/NP OP Progress Note Virtual Visit via Telephone Note  I connected with Kenneth Jimenez on 11/09/22 at  1:30 PM EST by telephone and verified that I am speaking with the correct person using two identifiers.  Location: Patient: home Provider: Clinic   I discussed the limitations, risks, security and privacy concerns of performing an evaluation and management service by telephone and the availability of in person appointments. I also discussed with the patient that there may be a patient responsible charge related to this service. The patient expressed understanding and agreed to proceed.   I provided 30 minutes of non-face-to-face time during this encounter.      11/09/2022 2:15 PM Kenneth Jimenez  MRN:  [id number]  Chief Complaint: ?"I am doing good"   HPI: 57 year old male seen today for follow up psychiatric evaluation.   He has a psychiatric history of bipolar disorder, PTSD, explosive disorder, depression, and cannabis use disorder.  Patient is currently prescribed  mirtazapine 30 mg at bedtime,  hydroxyzine 50 mg 3 times daily as needed, Paxil 40 mg daily, and Seroquel 400 HS. Patient notes that his medications are effective in managing his psychiatric conditions.    Today patient unable to logon virtually so assessment was done over the phone.  During exam he was pleasant, cooperative, and engaged in conversation. He reports that things are going well. He notes that he and his daughter Kenneth Jimenez enjoyed the Christmas Holiday and [place]. He notes that he was concerned about his older daughter who was hospitalized but is now doing well.  Patient informed Probation officer that his anxiety and depression are well-managed.  Today provider conducted a GAD-7 and patient scored a 7.  Provider also conducted PHQ-9 and patient scored a 12.  He endorses adequate sleep and appetite.  Today he denies SI/HI/VAH, mania, or paranoia.  Patient informed Probation officer that he has a tooth ache.  He notes that he  plans to follow-up with a dentist regarding this.  He does note that he takes Tylenol to help manage the pain.  No medication changes made today. Patient agreeable to continue medications as prescribed.      Visit Diagnosis:  No diagnosis found.    Past Psychiatric History:  bipolar disorder, PTSD, explosive disorder, depression, and cannabis use disorder  Past Medical History:  Past Medical History:  Diagnosis Date  . Anorexia   . Atrial fib/flutter, transient   . Back pain   . Bipolar 1 disorder ([place])   . Bipolar disorder ([place])   . Bradycardia   . Chest pain   . Coronary artery disease   . Hyperthyroidism   . Palpitations   . Seizures ([place])   . Tachyarrhythmia    No past surgical history on file.  Family Psychiatric History:  PTSD, Depression, Bipolar disorder,   Family History:  Family History  Problem Relation Age of Onset  . Heart disease Mother   . Diabetes Mother   . Heart failure Mother   . Heart disease Father   . Heart attack Father     Social History:  Social History   Socioeconomic History  . Marital status: Married    Spouse name: Not on file  . Number of children: Not on file  . Years of education: Not on file  . Highest education level: Not on file  Occupational History  . Not on file  Tobacco Use  . Smoking status: Every Day    Packs/day: 0.25    Types: Cigarettes  .  Smokeless tobacco: Current  Substance and Sexual Activity  . Alcohol use: No  . Drug use: Yes    Types: Marijuana  . Sexual activity: Yes  Other Topics Concern  . Not on file  Social History Narrative   Currently working to get medicaid.    Social Determinants of Health   Financial Resource Strain: Not on file  Food Insecurity: Not on file  Transportation Needs: Not on file  Physical Activity: Not on file  Stress: Not on file  Social Connections: Not on file    Allergies:  Allergies  Allergen Reactions  . Penicillins Hives    Metabolic Disorder Labs: No  results found for: "HGBA1C", "MPG" No results found for: "PROLACTIN" No results found for: "CHOL", "TRIG", "HDL", "CHOLHDL", "VLDL", "LDLCALC" Lab Results  Component Value Date   TSH 0.381 02/06/2013   TSH 0.376 03/21/2011    Therapeutic Level Labs: No results found for: "LITHIUM" Lab Results  Component Value Date   VALPROATE <10.0 (L) 12/22/2014   VALPROATE <10.0 (L) 09/11/2010   No results found for: "CBMZ"  Current Medications: Current Outpatient Medications  Medication Sig Dispense Refill  . chlorhexidine (PERIDEX) 0.12 % solution Use as directed 15 mLs in the mouth or throat 2 (two) times daily. Swish and spit.  Do not swallow. (Patient not taking: Reported on 06/18/2020) 120 mL 0  . fluconazole (DIFLUCAN) 200 MG tablet Take 1 tablet (200 mg total) by mouth daily. (Patient not taking: Reported on 06/18/2020) 7 tablet 0  . hydrOXYzine (ATARAX) 50 MG tablet Take 1 tablet (50 mg total) by mouth 3 (three) times daily as needed for anxiety (may take 2 tabs at bedtime). 90 tablet 3  . mirtazapine (REMERON) 30 MG tablet Take 1 tablet (30 mg total) by mouth at bedtime. 30 tablet 3  . PARoxetine (PAXIL) 40 MG tablet Take 1 tablet (40 mg total) by mouth daily. 30 tablet 3  . QUEtiapine (SEROQUEL) 400 MG tablet Take 1 tablet (400 mg total) by mouth at bedtime. 30 tablet 3   No current facility-administered medications for this visit.     Musculoskeletal: Strength & Muscle Tone:  Unable to assess due to telephone visit Gait & Station:  Unable to assess due to telephone visit Patient leans: N/A  Psychiatric Specialty Exam: Review of Systems  There were no vitals taken for this visit.There is no height or weight on file to calculate BMI.  General Appearance:  Unable to assess due to telephone visit  Eye Contact:   Unable to assess due to telephone visit  Speech:  Clear and Coherent and Normal Rate  Volume:  Normal  Mood:  Euthymic  Affect:  Appropriate and Congruent  Thought  Process:  Coherent, Goal Directed and Linear  Orientation:  Full (Time, Place, and Person)  Thought Content: WDL and Logical   Suicidal Thoughts:  No  Homicidal Thoughts:  No  Memory:  Immediate;   Good Recent;   Good Remote;   Good  Judgement:  Good  Insight:  Good  Psychomotor Activity:  Normal  Concentration:  Concentration: Good and Attention Span: Good  Recall:  Good  Fund of Knowledge: Good  Language: Good  Akathisia:  No  Handed:  Left  AIMS (if indicated): Not done  Assets:  Communication Skills Desire for Improvement Financial Resources/Insurance Housing Intimacy Leisure Time Social Support  ADL's:  Intact  Cognition: WNL  Sleep:  Good   Screenings: GAD-7   Flowsheet Row Video Visit from 09/14/2022 in [place]  [facility] Video Visit from 05/17/2022 in [facility] Office Visit from 02/14/2022 in [facility] Video Visit from 02/22/2021 in [facility] Video Visit from 11/26/2020 in [facility]  Total GAD-7 Score '7 5 17 15 20    '$ PHQ2-9   Flowsheet Row Video Visit from 09/14/2022 in [facility] Video Visit from 05/17/2022 in [facility] Office Visit from 02/14/2022 in [facility] Video Visit from 02/22/2021 in [facility] Video Visit from 11/26/2020 in [place]  PHQ-2 Total Score '2 1 3 4 6  '$ PHQ-9 Total Score '12 4 13 12 20    '$ Flowsheet Row Video Visit from 11/26/2020 in [facility] ED from 06/17/2020 in [facility] Department at [place] No Risk Error: Q3, 4, or 5 should not be populated when Q2 is No       Assessment and Plan: Patient notes that he is doing well on his current medication regiman. No medication changes made today.  Patient agreeable to continue medications as prescribed.   1. Bipolar I disorder, most recent episode depressed (HCC)  Continue- QUEtiapine (SEROQUEL) 400 MG tablet; TAKE 1 TABLET (400 MG TOTAL) BY MOUTH AT BEDTIME.  Dispense: 30 tablet; Refill: 3  2. Moderate episode of recurrent major depressive disorder (HCC)  Continue- PARoxetine (PAXIL) 40 MG tablet; TAKE 1 TABLET (40 MG TOTAL) BY MOUTH DAILY.  Dispense: 30 tablet; Refill: 3 Continue- mirtazapine (REMERON) 30 MG tablet; Take 1 tablet (30 mg total) by mouth at bedtime.  Dispense: 30 tablet; Refill: 3 Continue- hydrOXYzine (ATARAX) 50 MG tablet; Take 1 tablet (50 mg total) by mouth 3 (three) times daily as needed for anxiety (may take 2 tabs at bedtime).  Dispense: 90 tablet; Refill: 3     Follow up in 3 months Follow up with outpatient therapist.   Salley Slaughter, NP 11/09/2022, 2:15 PM

## 2022-11-10 ENCOUNTER — Telehealth (HOSPITAL_COMMUNITY): Payer: Medicaid Other | Admitting: Psychiatry

## 2022-11-10 ENCOUNTER — Other Ambulatory Visit: Payer: Self-pay

## 2022-11-10 ENCOUNTER — Other Ambulatory Visit (HOSPITAL_COMMUNITY): Payer: Self-pay | Admitting: Psychiatry

## 2022-11-10 DIAGNOSIS — F3130 Bipolar disorder, current episode depressed, mild or moderate severity, unspecified: Secondary | ICD-10-CM

## 2022-11-10 DIAGNOSIS — F331 Major depressive disorder, recurrent, moderate: Secondary | ICD-10-CM

## 2022-11-10 MED ORDER — HYDROXYZINE HCL 50 MG PO TABS
50.0000 mg | ORAL_TABLET | Freq: Three times a day (TID) | ORAL | 3 refills | Status: DC | PRN
  Filled 2022-11-10: qty 90, 30d supply, fill #0

## 2022-11-10 MED ORDER — QUETIAPINE FUMARATE 400 MG PO TABS
400.0000 mg | ORAL_TABLET | Freq: Every day | ORAL | 3 refills | Status: DC
  Filled 2022-11-10: qty 30, 30d supply, fill #0

## 2022-11-10 MED ORDER — PAROXETINE HCL 40 MG PO TABS
40.0000 mg | ORAL_TABLET | Freq: Every day | ORAL | 3 refills | Status: DC
  Filled 2022-11-10: qty 30, 30d supply, fill #0

## 2022-11-10 MED ORDER — MIRTAZAPINE 30 MG PO TABS
30.0000 mg | ORAL_TABLET | Freq: Every day | ORAL | 3 refills | Status: DC
  Filled 2022-11-10: qty 30, 30d supply, fill #0

## 2022-11-10 NOTE — Telephone Encounter (Signed)
Patient was unable to do his virtual visit today. Provider informed patient that telephone visits were no longer permitted. He endorsed understanding and notes that he would come in person  at his last visit. Today he notes that he has been doing well. He reports that his daughter Garwin Brothers has been in his custody for 9 months and reports that she has been thriving. Patient notes his mood is stable and reports that he has minimal anxiety and depression. Today he denies SI/HI/VAH, mania, or paranoia.

## 2022-11-23 ENCOUNTER — Ambulatory Visit (INDEPENDENT_AMBULATORY_CARE_PROVIDER_SITE_OTHER): Payer: Medicaid Other | Admitting: Clinical

## 2022-11-23 DIAGNOSIS — F3130 Bipolar disorder, current episode depressed, mild or moderate severity, unspecified: Secondary | ICD-10-CM | POA: Diagnosis not present
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
## 2022-12-14 ENCOUNTER — Ambulatory Visit (INDEPENDENT_AMBULATORY_CARE_PROVIDER_SITE_OTHER): Payer: Medicaid Other | Admitting: Clinical

## 2022-12-14 DIAGNOSIS — F3130 Bipolar disorder, current episode depressed, mild or moderate severity, unspecified: Secondary | ICD-10-CM

## 2022-12-14 NOTE — Progress Notes (Signed)
THERAPIST PROGRESS NOTE Virtual Visit via Video Note  I connected with Kenneth Jimenez on 12/14/22 at  1:00 PM EDT by a video enabled telemedicine application and verified that I am speaking with the correct person using two identifiers.  Location: Patient: home Provider: office   I discussed the limitations of evaluation and management by telemedicine and the availability of in person appointments. The patient expressed understanding and agreed to proceed.   Follow Up Instructions: I discussed the assessment and treatment plan with the patient. The patient was provided an opportunity to ask questions and all were answered. The patient agreed with the plan and demonstrated an understanding of the instructions.   The patient was advised to call back or seek an in-person evaluation if the symptoms worsen or if the condition fails to improve as anticipated.    Session Time: 30 minutes  Participation Level: Active  Behavioral Response: CasualAlertEuthymic  Type of Therapy: Individual Therapy  Treatment Goals addressed: client will identify negative behaviors that interfere with improved interpersonal and social interaction once per session  ProgressTowards Goals: Progressing  Interventions: CBT and Supportive  Summary:  Kenneth Jimenez is a 57 y.o. male who presents for the scheduled appointment oriented x 5 and friendly.  Client denies hallucinations and delusions.  Client had difficulty connecting with video the remainder of the appointment was conducted through telephone. Client reported on today he is doing well.  Client reported he has been working on keeping himself disconnected from any negativity.  Client reported he goes back to court regarding his ex fianc on April 25.  Client reported everything dealing with her should be over so he can move forward and not be bothered with thinking about her.  Client reported he and his daughter are doing well.  Client reported he is  happy to be able to be a stable and positive parent for his daughter but also reflects on his surprise of being almost 71 and being a single parent.  Client reported he has not rested the best lately with balancing taking care of his daughter and also his own needs.  Client reported his daughter has not phase where she vocalizes and missing her mother and he has to explain to her why she lives with him and is unable to see her.  Client reported she had some medication adjustments and reacted negatively was brought out some aggressive behaviors but that has since been fixed.  Client reported daily he focuses on being encouraging and teaching her how to communicate and cope with negative feelings that may come about that were triggered from her past trauma with her mother and her time in foster care as well. Evidence of progress towards goal: Client reported medication compliant 7 days/week.  Client reported practicing 1 coping skill that is also helping with his parenting such as calming techniques to help with his and his daughters communication 7 days/week.  Suicidal/Homicidal: Nowithout intent/plan  Therapist Response:  Therapist began the appointment asking the client how he has been doing since last seen. Therapist used CBT to engage using active listening and positive emotional support. Therapist used CBT to ask the client to identify areas that have caused some stress and to discuss how he effectively processes and cope with those stressors. Therapist used CBT to positively reinforce the clients initiated to to practice calming techniques, problem-solving skills, and positive communication for him and his daughter. Therapist used CBT ask the client to identify her progress with frequency of use with  coping skills with continued practice in her daily activity.    Therapist assigned client homework to practice self-care skills and emotion regulation techniques to help manage any negative emotions that  may arise when feeling stressed.   Plan: Return again in 3 weeks.  Diagnosis: bipolar 1 disorder, most recent episode depressed  Collaboration of Care: Patient refused AEB none requested by the client at this time.  Patient/Guardian was advised Release of Information must be obtained prior to any record release in order to collaborate their care with an outside provider. Patient/Guardian was advised if they have not already done so to contact the registration department to sign all necessary forms in order for [place] to release information regarding their care.   Consent: Patient/Guardian gives verbal consent for treatment and assignment of benefits for services provided during this visit. Patient/Guardian expressed understanding and agreed to proceed.   [place], LCSW 12/14/2022

## 2023-01-24 ENCOUNTER — Other Ambulatory Visit: Payer: Self-pay

## 2023-01-24 ENCOUNTER — Telehealth (INDEPENDENT_AMBULATORY_CARE_PROVIDER_SITE_OTHER): Payer: Medicaid Other | Admitting: Psychiatry

## 2023-01-24 ENCOUNTER — Encounter (HOSPITAL_COMMUNITY): Payer: Self-pay | Admitting: Psychiatry

## 2023-01-24 DIAGNOSIS — F3130 Bipolar disorder, current episode depressed, mild or moderate severity, unspecified: Secondary | ICD-10-CM

## 2023-01-24 DIAGNOSIS — F331 Major depressive disorder, recurrent, moderate: Secondary | ICD-10-CM

## 2023-01-24 MED ORDER — HYDROXYZINE HCL 50 MG PO TABS
50.0000 mg | ORAL_TABLET | Freq: Three times a day (TID) | ORAL | 3 refills | Status: DC | PRN
  Filled 2023-01-24: qty 90, 30d supply, fill #0

## 2023-01-24 MED ORDER — QUETIAPINE FUMARATE 400 MG PO TABS
400.0000 mg | ORAL_TABLET | Freq: Every day | ORAL | 3 refills | Status: DC
  Filled 2023-01-24: qty 30, 30d supply, fill #0

## 2023-01-24 MED ORDER — MIRTAZAPINE 30 MG PO TABS
30.0000 mg | ORAL_TABLET | Freq: Every day | ORAL | 3 refills | Status: DC
  Filled 2023-01-24: qty 30, 30d supply, fill #0

## 2023-01-24 MED ORDER — PAROXETINE HCL 40 MG PO TABS
40.0000 mg | ORAL_TABLET | Freq: Every day | ORAL | 3 refills | Status: DC
  Filled 2023-01-24: qty 30, 30d supply, fill #0
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
## 2023-01-31 ENCOUNTER — Other Ambulatory Visit: Payer: Self-pay

## 2023-02-01 ENCOUNTER — Telehealth (HOSPITAL_COMMUNITY): Payer: Self-pay | Admitting: Psychiatry

## 2023-02-01 ENCOUNTER — Ambulatory Visit (INDEPENDENT_AMBULATORY_CARE_PROVIDER_SITE_OTHER): Payer: Medicaid Other | Admitting: Clinical

## 2023-02-01 DIAGNOSIS — F3130 Bipolar disorder, current episode depressed, mild or moderate severity, unspecified: Secondary | ICD-10-CM | POA: Diagnosis not present
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
## 2023-02-03 NOTE — Telephone Encounter (Signed)
Provider informed patient that HR staff would reach out to him. He notified Clinical research associate that he was contacted yesterday and notes that he has been able to access his [place] medical records and have them faxed to DSS. He notes that Vesta Mixer has also informed him that his records can be sent to DSS. No other concerns at this time.

## 2023-04-11 ENCOUNTER — Encounter (HOSPITAL_COMMUNITY): Payer: Self-pay

## 2023-04-11 ENCOUNTER — Ambulatory Visit (HOSPITAL_COMMUNITY): Payer: MEDICAID | Admitting: Clinical

## 2023-04-12 ENCOUNTER — Telehealth (HOSPITAL_COMMUNITY): Payer: MEDICAID | Admitting: Psychiatry

## 2023-04-12 ENCOUNTER — Other Ambulatory Visit: Payer: Self-pay

## 2023-04-12 ENCOUNTER — Encounter (HOSPITAL_COMMUNITY): Payer: Self-pay | Admitting: Psychiatry

## 2023-04-12 DIAGNOSIS — F3130 Bipolar disorder, current episode depressed, mild or moderate severity, unspecified: Secondary | ICD-10-CM

## 2023-04-12 DIAGNOSIS — F331 Major depressive disorder, recurrent, moderate: Secondary | ICD-10-CM

## 2023-04-12 MED ORDER — MIRTAZAPINE 30 MG PO TABS
30.0000 mg | ORAL_TABLET | Freq: Every day | ORAL | 3 refills | Status: DC
Start: 2023-04-12 — End: 2023-07-05
  Filled 2023-04-12: qty 30, 30d supply, fill #0

## 2023-04-12 MED ORDER — PAROXETINE HCL 40 MG PO TABS
40.0000 mg | ORAL_TABLET | Freq: Every day | ORAL | 3 refills | Status: DC
Start: 2023-04-12 — End: 2023-07-05
  Filled 2023-04-12: qty 30, 30d supply, fill #0

## 2023-04-12 MED ORDER — HYDROXYZINE HCL 50 MG PO TABS
50.0000 mg | ORAL_TABLET | Freq: Three times a day (TID) | ORAL | 3 refills | Status: DC | PRN
Start: 2023-04-12 — End: 2023-07-05
  Filled 2023-04-12: qty 90, 23d supply, fill #0

## 2023-04-12 MED ORDER — QUETIAPINE FUMARATE 400 MG PO TABS
400.0000 mg | ORAL_TABLET | Freq: Every day | ORAL | 3 refills | Status: DC
Start: 2023-04-12 — End: 2023-07-05
  Filled 2023-04-12: qty 30, 30d supply, fill #0

## 2023-04-12 NOTE — Progress Notes (Signed)
BH MD/PA/NP OP Progress Note Virtual Visit via Video Note  I connected with [name] on 04/12/23 at 11:00 AM EDT by a video enabled telemedicine application and verified that I am speaking with the correct person using two identifiers.  Location: Patient: Home Provider: Clinic   I discussed the limitations of evaluation and management by telemedicine and the availability of in person appointments. The patient expressed understanding and agreed to proceed.  I provided 30 minutes of non-face-to-face time during this encounter.        04/12/2023 1:28 PM Camille Shillito  MRN:  [id number]  Chief Complaint: "I get upset when my daughter is upset"   HPI: 57 year old male seen today for follow up psychiatric evaluation.   He has a psychiatric history of bipolar disorder, PTSD, explosive disorder, depression, and cannabis use disorder.  Patient is currently prescribed  mirtazapine 30 mg at bedtime,  hydroxyzine 50 mg 3 times daily as needed, Paxil 40 mg daily, and Seroquel 400 HS. Patient notes that his medications are effective in managing his psychiatric conditions.    Today patient well groomed, pleasant, cooperative, and engaged in conversation. He informed Clinical research associate that he becomes upset when his daughter becomes upset.  Recently he notes that she has been having behavioral outburst from her past trauma and taking it out on him.  Despite distraction he informed Clinical research associate that he is doing well.  He reports that when he is able to better Kyla mentally stable he plans to get help for her twin sister.   Please inform her that her anxiety and depression are well-managed.  He reports that he is sleeping and eating well.  Today he denies SI/HI/VH, mania, paranoia.  At this time patient requested not to do a GAD-7 or PHQ-9.   No medication changes made today.  Patient agreeable to continue medication prescribed.  No other concerns at this time.     Visit Diagnosis:    ICD-10-CM   1. Moderate  episode of recurrent major depressive disorder (HCC)  F33.1 hydrOXYzine (ATARAX) 50 MG tablet    mirtazapine (REMERON) 30 MG tablet    PARoxetine (PAXIL) 40 MG tablet    2. Bipolar I disorder, most recent episode depressed (HCC)  F31.30 QUEtiapine (SEROQUEL) 400 MG tablet        Past Psychiatric History:  bipolar disorder, PTSD, explosive disorder, depression, and cannabis use disorder  Past Medical History:  Past Medical History:  Diagnosis Date   Anorexia    Atrial fib/flutter, transient (HCC)    Back pain    Bipolar 1 disorder (HCC)    Bipolar disorder (HCC)    Bradycardia    Chest pain    Coronary artery disease    Hyperthyroidism    Palpitations    Seizures (HCC)    Tachyarrhythmia    No past surgical history on file.  Family Psychiatric History: Daughter bipolar, intermitted explosive disorder, PTSD, ADHD, and autism, daughter ADHD   Family History:  Family History  Problem Relation Age of Onset   Heart disease Mother    Diabetes Mother    Heart failure Mother    Heart disease Father    Heart attack Father     Social History:  Social History   Socioeconomic History   Marital status: Married    Spouse name: Not on file   Number of children: Not on file   Years of education: Not on file   Highest education level: Not on file  Occupational History  Not on file  Tobacco Use   Smoking status: Every Day    Current packs/day: 0.25    Types: Cigarettes   Smokeless tobacco: Current  Substance and Sexual Activity   Alcohol use: No   Drug use: Yes    Types: Marijuana   Sexual activity: Yes  Other Topics Concern   Not on file  Social History Narrative   Currently working to get medicaid.    Social Determinants of Health   Financial Resource Strain: Not on file  Food Insecurity: Not on file  Transportation Needs: Not on file  Physical Activity: Not on file  Stress: Not on file  Social Connections: Not on file    Allergies:  Allergies  Allergen  Reactions   Penicillins Hives    Metabolic Disorder Labs: No results found for: "HGBA1C", "MPG" No results found for: "PROLACTIN" No results found for: "CHOL", "TRIG", "HDL", "CHOLHDL", "VLDL", "LDLCALC" Lab Results  Component Value Date   TSH 0.381 02/06/2013   TSH 0.376 03/21/2011    Therapeutic Level Labs: No results found for: "LITHIUM" Lab Results  Component Value Date   VALPROATE <10.0 (L) 12/22/2014   VALPROATE <10.0 (L) 09/11/2010   No results found for: "CBMZ"  Current Medications: Current Outpatient Medications  Medication Sig Dispense Refill   chlorhexidine (PERIDEX) 0.12 % solution Use as directed 15 mLs in the mouth or throat 2 (two) times daily. Swish and spit.  Do not swallow. (Patient not taking: Reported on 06/18/2020) 120 mL 0   fluconazole (DIFLUCAN) 200 MG tablet Take 1 tablet (200 mg total) by mouth daily. (Patient not taking: Reported on 06/18/2020) 7 tablet 0   hydrOXYzine (ATARAX) 50 MG tablet Take 1 tablet (50 mg total) by mouth 3 (three) times daily as needed for anxiety (may take 2 tabs at bedtime). 90 tablet 3   mirtazapine (REMERON) 30 MG tablet Take 1 tablet (30 mg total) by mouth at bedtime. 30 tablet 3   PARoxetine (PAXIL) 40 MG tablet Take 1 tablet (40 mg total) by mouth daily. 30 tablet 3   QUEtiapine (SEROQUEL) 400 MG tablet Take 1 tablet (400 mg total) by mouth at bedtime. 30 tablet 3   No current facility-administered medications for this visit.     Musculoskeletal: Strength & Muscle Tone: within normal limits and telehealth visit Gait & Station: normal, telehealth visit Patient leans: N/A  Psychiatric Specialty Exam: Review of Systems  There were no vitals taken for this visit.There is no height or weight on file to calculate BMI.  General Appearance: Well Groomed  Eye Contact:  Good  Speech:  Clear and Coherent and Normal Rate  Volume:  Normal  Mood:  Euthymic  Affect:  Appropriate and Congruent  Thought Process:  Coherent, Goal  Directed and Linear  Orientation:  Full (Time, Place, and Person)  Thought Content: WDL and Logical   Suicidal Thoughts:  No  Homicidal Thoughts:  No  Memory:  Immediate;   Good Recent;   Good Remote;   Good  Judgement:  Good  Insight:  Good  Psychomotor Activity:  Normal  Concentration:  Concentration: Good and Attention Span: Good  Recall:  Good  Fund of Knowledge: Good  Language: Good  Akathisia:  No  Handed:  Left  AIMS (if indicated): Not done  Assets:  Communication Skills Desire for Improvement Financial Resources/Insurance Housing Intimacy Leisure Time Social Support  ADL's:  Intact  Cognition: WNL  Sleep:  Good   Screenings: GAD-7    Flowsheet Row  Video Visit from 01/24/2023 in [facility] Video Visit from 09/14/2022 in [facility] Video Visit from 05/17/2022 in [facility] Office Visit from 02/14/2022 in [facility] Video Visit from 02/22/2021 in [facility]  Total GAD-7 Score 10 7 5 17 15       PHQ2-9    Flowsheet Row Video Visit from 01/24/2023 in [facility] Video Visit from 09/14/2022 in [facility] Video Visit from 05/17/2022 in [facility] Office Visit from 02/14/2022 in [facility] Video Visit from 02/22/2021 in [facility]  PHQ-2 Total Score 2 2 1 3 4   PHQ-9 Total Score 4 12 4 13 12       Flowsheet Row Video Visit from 11/26/2020 in [facility] ED from 06/17/2020 in [facility] at [facility]  C-SSRS RISK CATEGORY No Risk Error: Q3, 4, or 5 should not be populated when Q2 is No        Assessment and Plan: Patient notes that he is doing well on his current medication regiman. No medication changes made today. Patient agreeable  to continue medications as prescribed. 1. Moderate episode of recurrent major depressive disorder (HCC)  Continue- hydrOXYzine (ATARAX) 50 MG tablet; Take 1 tablet (50 mg total) by mouth 3 (three) times daily as needed for anxiety (may take 2 tabs at bedtime).  Dispense: 90 tablet; Refill: 3 Continue- mirtazapine (REMERON) 30 MG tablet; Take 1 tablet (30 mg total) by mouth at bedtime.  Dispense: 30 tablet; Refill: 3 Continue- PARoxetine (PAXIL) 40 MG tablet; Take 1 tablet (40 mg total) by mouth daily.  Dispense: 30 tablet; Refill: 3  2. Bipolar I disorder, most recent episode depressed (HCC)  Continue- QUEtiapine (SEROQUEL) 400 MG tablet; Take 1 tablet (400 mg total) by mouth at bedtime.  Dispense: 30 tablet; Refill: 3 - CBC w/Diff/Platelet - Hepatic function panel - Thyroid Panel With TSH - Comprehensive Metabolic Panel (CMET) - Lipid Profile - Prolactin - HgB A1c      Follow up in 3 months Follow up with outpatient therapist.   Shanna Cisco, NP 04/12/2023, 1:28 PM

## 2023-04-18 ENCOUNTER — Other Ambulatory Visit: Payer: Self-pay

## 2023-07-05 ENCOUNTER — Other Ambulatory Visit: Payer: Self-pay

## 2023-07-05 ENCOUNTER — Encounter (HOSPITAL_COMMUNITY): Payer: Self-pay | Admitting: Psychiatry

## 2023-07-05 ENCOUNTER — Telehealth (INDEPENDENT_AMBULATORY_CARE_PROVIDER_SITE_OTHER): Payer: MEDICAID | Admitting: Psychiatry

## 2023-07-05 DIAGNOSIS — F3130 Bipolar disorder, current episode depressed, mild or moderate severity, unspecified: Secondary | ICD-10-CM | POA: Diagnosis not present

## 2023-07-05 DIAGNOSIS — F411 Generalized anxiety disorder: Secondary | ICD-10-CM | POA: Diagnosis not present

## 2023-07-05 MED ORDER — HYDROXYZINE HCL 50 MG PO TABS
50.0000 mg | ORAL_TABLET | Freq: Three times a day (TID) | ORAL | 3 refills | Status: DC | PRN
  Filled 2023-07-05: qty 90, 23d supply, fill #0

## 2023-07-05 MED ORDER — QUETIAPINE FUMARATE 400 MG PO TABS
400.0000 mg | ORAL_TABLET | Freq: Every day | ORAL | 3 refills | Status: DC
Start: 2023-07-05 — End: 2023-09-20
  Filled 2023-07-05: qty 30, 30d supply, fill #0

## 2023-07-05 MED ORDER — PAROXETINE HCL 40 MG PO TABS
40.0000 mg | ORAL_TABLET | Freq: Every day | ORAL | 3 refills | Status: DC
Start: 2023-07-05 — End: 2023-09-20
  Filled 2023-07-05: qty 30, 30d supply, fill #0

## 2023-07-05 MED ORDER — MIRTAZAPINE 30 MG PO TABS
30.0000 mg | ORAL_TABLET | Freq: Every day | ORAL | 3 refills | Status: DC
  Filled 2023-07-05: qty 30, 30d supply, fill #0
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
## 2023-07-17 ENCOUNTER — Other Ambulatory Visit: Payer: Self-pay

## 2023-07-18 ENCOUNTER — Other Ambulatory Visit: Payer: Self-pay

## 2023-07-18 ENCOUNTER — Emergency Department (HOSPITAL_COMMUNITY)
Admission: EM | Admit: 2023-07-18 | Discharge: 2023-07-18 | Disposition: A | Discharge status: Home / Self Care | Payer: MEDICAID | Attending: Emergency Medicine | Admitting: Emergency Medicine

## 2023-07-18 ENCOUNTER — Emergency Department (HOSPITAL_COMMUNITY): Payer: MEDICAID

## 2023-07-18 ENCOUNTER — Encounter (HOSPITAL_COMMUNITY): Payer: Self-pay

## 2023-07-18 DIAGNOSIS — S40021A Contusion of right upper arm, initial encounter: Secondary | ICD-10-CM

## 2023-07-18 DIAGNOSIS — K069 Disorder of gingiva and edentulous alveolar ridge, unspecified: Secondary | ICD-10-CM | POA: Diagnosis not present

## 2023-07-18 DIAGNOSIS — K029 Dental caries, unspecified: Secondary | ICD-10-CM | POA: Diagnosis not present

## 2023-07-18 DIAGNOSIS — S4991XA Unspecified injury of right shoulder and upper arm, initial encounter: Secondary | ICD-10-CM | POA: Diagnosis present

## 2023-07-18 DIAGNOSIS — S0081XA Abrasion of other part of head, initial encounter: Secondary | ICD-10-CM

## 2023-07-18 DIAGNOSIS — Z8659 Personal history of other mental and behavioral disorders: Secondary | ICD-10-CM | POA: Diagnosis not present

## 2023-07-18 DIAGNOSIS — Z23 Encounter for immunization: Secondary | ICD-10-CM | POA: Insufficient documentation

## 2023-07-18 MED ORDER — ACETAMINOPHEN 500 MG PO TABS
1000.0000 mg | ORAL_TABLET | Freq: Once | ORAL | Status: AC
  Administered 2023-07-18: 1000 mg via ORAL
  Filled 2023-07-18: qty 2

## 2023-07-18 MED ORDER — IBUPROFEN 400 MG PO TABS
400.0000 mg | ORAL_TABLET | Freq: Once | ORAL | Status: AC
  Administered 2023-07-18: 400 mg via ORAL
  Filled 2023-07-18: qty 1

## 2023-07-18 MED ORDER — TETANUS-DIPHTH-ACELL PERTUSSIS 5-2.5-18.5 LF-MCG/0.5 IM SUSY
0.5000 mL | PREFILLED_SYRINGE | Freq: Once | INTRAMUSCULAR | Status: AC
  Administered 2023-07-18: 0.5 mL via INTRAMUSCULAR
  Filled 2023-07-18: qty 0.5

## 2023-07-18 NOTE — ED Notes (Signed)
Patient Alert and oriented to baseline. Stable and ambulatory to baseline. Patient verbalized understanding of the discharge instructions.  Patient belongings were taken by the patient.   

## 2023-07-18 NOTE — ED Notes (Signed)
Patient transported to X-ray 

## 2023-07-18 NOTE — Discharge Instructions (Signed)
It was our pleasure to provide your ER care today - we hope that you feel better.  Keep abrasions very clean.  Take acetaminophen or ibuprofen as need.   For dental/gum disease, follow up with dentist in the next 1-2 weeks.   Return to ER if worse, new symptoms, new/severe pain, infection of wound, fevers, or other concern.
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
## 2023-09-20 ENCOUNTER — Telehealth (HOSPITAL_COMMUNITY): Payer: MEDICAID | Admitting: Psychiatry

## 2023-09-20 ENCOUNTER — Encounter (HOSPITAL_COMMUNITY): Payer: Self-pay | Admitting: Psychiatry

## 2023-09-20 ENCOUNTER — Other Ambulatory Visit: Payer: Self-pay

## 2023-09-20 DIAGNOSIS — F411 Generalized anxiety disorder: Secondary | ICD-10-CM | POA: Diagnosis not present

## 2023-09-20 DIAGNOSIS — F3130 Bipolar disorder, current episode depressed, mild or moderate severity, unspecified: Secondary | ICD-10-CM | POA: Diagnosis not present

## 2023-09-20 MED ORDER — QUETIAPINE FUMARATE 400 MG PO TABS
400.0000 mg | ORAL_TABLET | Freq: Every day | ORAL | 3 refills | Status: DC
  Filled 2023-09-20: qty 30, 30d supply, fill #0

## 2023-09-20 MED ORDER — MIRTAZAPINE 30 MG PO TABS
30.0000 mg | ORAL_TABLET | Freq: Every day | ORAL | 3 refills | Status: DC
  Filled 2023-09-20: qty 30, 30d supply, fill #0

## 2023-09-20 MED ORDER — PAROXETINE HCL 40 MG PO TABS
40.0000 mg | ORAL_TABLET | Freq: Every day | ORAL | 3 refills | Status: DC
  Filled 2023-09-20: qty 30, 30d supply, fill #0

## 2023-09-20 MED ORDER — HYDROXYZINE HCL 50 MG PO TABS
50.0000 mg | ORAL_TABLET | Freq: Three times a day (TID) | ORAL | 3 refills | Status: DC | PRN
  Filled 2023-09-20: qty 90, 30d supply, fill #0
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
## 2023-09-21 ENCOUNTER — Ambulatory Visit (HOSPITAL_COMMUNITY): Payer: MEDICAID | Admitting: Clinical

## 2023-09-21 DIAGNOSIS — F3130 Bipolar disorder, current episode depressed, mild or moderate severity, unspecified: Secondary | ICD-10-CM | POA: Diagnosis not present
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
## 2023-10-02 ENCOUNTER — Other Ambulatory Visit: Payer: Self-pay

## 2023-10-24 ENCOUNTER — Ambulatory Visit (INDEPENDENT_AMBULATORY_CARE_PROVIDER_SITE_OTHER): Payer: MEDICAID | Admitting: Clinical

## 2023-10-24 DIAGNOSIS — F3130 Bipolar disorder, current episode depressed, mild or moderate severity, unspecified: Secondary | ICD-10-CM | POA: Diagnosis not present
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
## 2023-11-10 ENCOUNTER — Ambulatory Visit (HOSPITAL_COMMUNITY): Payer: MEDICAID | Admitting: Clinical

## 2023-11-10 ENCOUNTER — Encounter (HOSPITAL_COMMUNITY): Payer: Self-pay

## 2023-11-21 ENCOUNTER — Telehealth (HOSPITAL_COMMUNITY): Payer: Self-pay | Admitting: Psychiatry

## 2023-11-22 ENCOUNTER — Other Ambulatory Visit: Payer: Self-pay

## 2023-11-22 ENCOUNTER — Encounter (HOSPITAL_COMMUNITY): Payer: Self-pay | Admitting: Psychiatry

## 2023-11-22 ENCOUNTER — Telehealth (INDEPENDENT_AMBULATORY_CARE_PROVIDER_SITE_OTHER): Payer: MEDICAID | Admitting: Psychiatry

## 2023-11-22 DIAGNOSIS — F411 Generalized anxiety disorder: Secondary | ICD-10-CM | POA: Diagnosis not present

## 2023-11-22 DIAGNOSIS — F3130 Bipolar disorder, current episode depressed, mild or moderate severity, unspecified: Secondary | ICD-10-CM

## 2023-11-22 MED ORDER — HYDROXYZINE HCL 50 MG PO TABS
50.0000 mg | ORAL_TABLET | Freq: Three times a day (TID) | ORAL | 3 refills | Status: DC | PRN
  Filled 2023-11-22: qty 90, 30d supply, fill #0

## 2023-11-22 MED ORDER — PAROXETINE HCL 40 MG PO TABS
40.0000 mg | ORAL_TABLET | Freq: Every day | ORAL | 3 refills | Status: DC
  Filled 2023-11-22: qty 30, 30d supply, fill #0

## 2023-11-22 MED ORDER — MIRTAZAPINE 30 MG PO TABS
30.0000 mg | ORAL_TABLET | Freq: Every day | ORAL | 3 refills | Status: DC
  Filled 2023-11-22: qty 30, 30d supply, fill #0

## 2023-11-22 MED ORDER — QUETIAPINE FUMARATE 400 MG PO TABS
400.0000 mg | ORAL_TABLET | Freq: Every day | ORAL | 3 refills | Status: DC
  Filled 2023-11-22: qty 30, 30d supply, fill #0
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
## 2023-11-22 NOTE — Telephone Encounter (Signed)
 Provider saw patient today during visit.  Provider got patient rescheduled therapy.

## 2023-11-29 ENCOUNTER — Ambulatory Visit (HOSPITAL_COMMUNITY): Payer: MEDICAID | Admitting: Clinical

## 2023-11-29 ENCOUNTER — Telehealth (HOSPITAL_COMMUNITY): Payer: MEDICAID | Admitting: Psychiatry

## 2023-11-29 ENCOUNTER — Encounter (HOSPITAL_COMMUNITY): Payer: Self-pay

## 2023-12-01 ENCOUNTER — Other Ambulatory Visit: Payer: Self-pay

## 2023-12-19 ENCOUNTER — Other Ambulatory Visit: Payer: Self-pay

## 2024-01-31 ENCOUNTER — Other Ambulatory Visit: Payer: Self-pay

## 2024-01-31 ENCOUNTER — Telehealth (INDEPENDENT_AMBULATORY_CARE_PROVIDER_SITE_OTHER): Payer: MEDICAID | Admitting: Psychiatry

## 2024-01-31 ENCOUNTER — Encounter (HOSPITAL_COMMUNITY): Payer: Self-pay | Admitting: Psychiatry

## 2024-01-31 DIAGNOSIS — F3130 Bipolar disorder, current episode depressed, mild or moderate severity, unspecified: Secondary | ICD-10-CM

## 2024-01-31 DIAGNOSIS — F411 Generalized anxiety disorder: Secondary | ICD-10-CM | POA: Diagnosis not present

## 2024-01-31 MED ORDER — MIRTAZAPINE 30 MG PO TABS
30.0000 mg | ORAL_TABLET | Freq: Every day | ORAL | 3 refills | Status: DC
  Filled 2024-01-31: qty 30, 30d supply, fill #0

## 2024-01-31 MED ORDER — HYDROXYZINE HCL 50 MG PO TABS
50.0000 mg | ORAL_TABLET | Freq: Three times a day (TID) | ORAL | 3 refills | Status: DC | PRN
  Filled 2024-01-31: qty 90, 30d supply, fill #0

## 2024-01-31 MED ORDER — QUETIAPINE FUMARATE 400 MG PO TABS
400.0000 mg | ORAL_TABLET | Freq: Every day | ORAL | 3 refills | Status: DC
  Filled 2024-01-31: qty 30, 30d supply, fill #0

## 2024-01-31 MED ORDER — PAROXETINE HCL 40 MG PO TABS
40.0000 mg | ORAL_TABLET | Freq: Every day | ORAL | 3 refills | Status: DC
  Filled 2024-01-31: qty 30, 30d supply, fill #0
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
## 2024-02-02 ENCOUNTER — Ambulatory Visit (INDEPENDENT_AMBULATORY_CARE_PROVIDER_SITE_OTHER): Payer: Self-pay | Admitting: Clinical

## 2024-02-02 DIAGNOSIS — F3130 Bipolar disorder, current episode depressed, mild or moderate severity, unspecified: Secondary | ICD-10-CM
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
## 2024-02-12 ENCOUNTER — Other Ambulatory Visit: Payer: Self-pay

## 2024-05-01 ENCOUNTER — Telehealth (HOSPITAL_COMMUNITY): Payer: MEDICAID | Admitting: Physician Assistant

## 2024-05-01 ENCOUNTER — Encounter (HOSPITAL_COMMUNITY): Payer: Self-pay | Admitting: Physician Assistant

## 2024-05-01 ENCOUNTER — Other Ambulatory Visit: Payer: Self-pay

## 2024-05-01 DIAGNOSIS — F3130 Bipolar disorder, current episode depressed, mild or moderate severity, unspecified: Secondary | ICD-10-CM | POA: Diagnosis not present

## 2024-05-01 DIAGNOSIS — Z79899 Other long term (current) drug therapy: Secondary | ICD-10-CM

## 2024-05-01 DIAGNOSIS — F411 Generalized anxiety disorder: Secondary | ICD-10-CM

## 2024-05-01 MED ORDER — MIRTAZAPINE 30 MG PO TABS
30.0000 mg | ORAL_TABLET | Freq: Every day | ORAL | 3 refills | Status: DC
  Filled 2024-05-01: qty 30, 30d supply, fill #0

## 2024-05-01 MED ORDER — HYDROXYZINE HCL 50 MG PO TABS
50.0000 mg | ORAL_TABLET | Freq: Three times a day (TID) | ORAL | 3 refills | Status: DC | PRN
  Filled 2024-05-01: qty 90, 30d supply, fill #0

## 2024-05-01 MED ORDER — QUETIAPINE FUMARATE 400 MG PO TABS
400.0000 mg | ORAL_TABLET | Freq: Every day | ORAL | 3 refills | Status: DC
  Filled 2024-05-01: qty 30, 30d supply, fill #0

## 2024-05-01 NOTE — Progress Notes (Unsigned)
 BH MD/PA/NP OP Progress Note  Virtual Visit via Video Note  I connected with Kenneth Jimenez on 05/01/24 at 11:00 AM EDT by a video enabled telemedicine application and verified that I am speaking with the correct person using two identifiers.  Location: Patient: Home Provider: Clinic   I discussed the limitations of evaluation and management by telemedicine and the availability of in person appointments. The patient expressed understanding and agreed to proceed.  Follow Up Instructions:   I discussed the assessment and treatment plan with the patient. The patient was provided an opportunity to ask questions and all were answered. The patient agreed with the plan and demonstrated an understanding of the instructions.   The patient was advised to call back or seek an in-person evaluation if the symptoms worsen or if the condition fails to improve as anticipated.  I provided 31 minutes of non-face-to-face time during this encounter.  Reginia FORBES Bolster, PA    05/01/2024 5:57 PM Ahren Pettinger  MRN:  [id number]  Chief Complaint:  Chief Complaint  Patient presents with   Follow-up   Medication Refill   HPI: ***  Kenneth Jimenez ***  Visit Diagnosis:    ICD-10-CM   1. Generalized anxiety disorder  F41.1 mirtazapine  (REMERON ) 30 MG tablet    hydrOXYzine  (ATARAX ) 50 MG tablet    2. Bipolar I disorder, most recent episode depressed (HCC)  F31.30 QUEtiapine  (SEROQUEL ) 400 MG tablet      Past Psychiatric History:  Bipolar disorder PTSD Explosive disorder Depression Cannabis use disorder   Past Medical History:  Past Medical History:  Diagnosis Date   Anorexia    Atrial fib/flutter, transient (HCC)    Back pain    Bipolar 1 disorder (HCC)    Bipolar disorder (HCC)    Bradycardia    Chest pain    Coronary artery disease    Hyperthyroidism    Palpitations    Seizures (HCC)    Tachyarrhythmia    History reviewed. No pertinent surgical history.  Family Psychiatric  History:  Daughter - bipolar, intermitted explosive disorder, PTSD, ADHD, and autism Daughter - ADHD   Family History:  Family History  Problem Relation Age of Onset   Heart disease Mother    Diabetes Mother    Heart failure Mother    Heart disease Father    Heart attack Father     Social History:  Social History   Socioeconomic History   Marital status: Legally Separated    Spouse name: Not on file   Number of children: Not on file   Years of education: Not on file   Highest education level: Not on file  Occupational History   Not on file  Tobacco Use   Smoking status: Every Day    Current packs/day: 0.25    Types: Cigarettes   Smokeless tobacco: Current  Substance and Sexual Activity   Alcohol use: No   Drug use: Yes    Types: Marijuana   Sexual activity: Yes  Other Topics Concern   Not on file  Social History Narrative   Currently working to get medicaid.    Social Drivers of Corporate investment banker Strain: Not on file  Food Insecurity: Not on file  Transportation Needs: Not on file  Physical Activity: Not on file  Stress: Not on file  Social Connections: Not on file    Allergies:  Allergies  Allergen Reactions   Penicillins Hives    Metabolic Disorder Labs: No results found for: HGBA1C,  MPG No results found for: PROLACTIN No results found for: CHOL, TRIG, HDL, CHOLHDL, VLDL, LDLCALC Lab Results  Component Value Date   TSH 0.381 02/06/2013   TSH 0.376 03/21/2011    Therapeutic Level Labs: No results found for: LITHIUM Lab Results  Component Value Date   VALPROATE <10.0 (L) 12/22/2014   VALPROATE <10.0 (L) 09/11/2010   No results found for: CBMZ  Current Medications: Current Outpatient Medications  Medication Sig Dispense Refill   chlorhexidine  (PERIDEX ) 0.12 % solution Use as directed 15 mLs in the mouth or throat 2 (two) times daily. Swish and spit.  Do not swallow. (Patient not taking: Reported on 06/18/2020)  120 mL 0   fluconazole  (DIFLUCAN ) 200 MG tablet Take 1 tablet (200 mg total) by mouth daily. (Patient not taking: Reported on 06/18/2020) 7 tablet 0   hydrOXYzine  (ATARAX ) 50 MG tablet Take 1 tablet (50 mg total) by mouth 3 (three) times daily as needed for anxiety (may take 2 tabs at bedtime). 90 tablet 3   mirtazapine  (REMERON ) 30 MG tablet Take 1 tablet (30 mg total) by mouth at bedtime. 30 tablet 3   PARoxetine  (PAXIL ) 40 MG tablet Take 1 tablet (40 mg total) by mouth daily. 30 tablet 3   QUEtiapine  (SEROQUEL ) 400 MG tablet Take 1 tablet (400 mg total) by mouth at bedtime. 30 tablet 3   No current facility-administered medications for this visit.     Musculoskeletal: Strength & Muscle Tone: within normal limits Gait & Station: normal Patient leans: N/A  Psychiatric Specialty Exam: Review of Systems  Psychiatric/Behavioral:  Positive for dysphoric mood. Negative for decreased concentration, hallucinations, self-injury, sleep disturbance and suicidal ideas. The patient is nervous/anxious. The patient is not hyperactive.     There were no vitals taken for this visit.There is no height or weight on file to calculate BMI.  General Appearance: Casual  Eye Contact:  Good  Speech:  Clear and Coherent and Normal Rate  Volume:  Normal  Mood:  Anxious and Depressed  Affect:  Appropriate  Thought Process:  Coherent, Goal Directed, and Descriptions of Associations: Intact  Orientation:  Full (Time, Place, and Person)  Thought Content: WDL   Suicidal Thoughts:  No  Homicidal Thoughts:  No  Memory:  Immediate;   Good Recent;   Good Remote;   Good  Judgement:  Good  Insight:  Good  Psychomotor Activity:  Normal  Concentration:  Concentration: Good and Attention Span: Good  Recall:  Good  Fund of Knowledge: Good  Language: Good  Akathisia:  No  Handed:  Left  AIMS (if indicated): done; 0  Assets:  Communication Skills Desire for Improvement Financial  Resources/Insurance Housing Intimacy Leisure Time Social Support  ADL's:  Intact  Cognition: WNL  Sleep:  Good   Screenings: AIMS    Flowsheet Row Video Visit from 05/01/2024 in [facility]  AIMS Total Score 0   GAD-7    Flowsheet Row Video Visit from 05/01/2024 in [facility] Video Visit from 01/31/2024 in [facility] Video Visit from 09/20/2023 in [facility] Video Visit from 07/05/2023 in [facility] Video Visit from 01/24/2023 in [facility]  Total GAD-7 Score 5 0 5 12 10    PHQ2-9    Flowsheet Row Video Visit from 05/01/2024 in [facility] Video Visit from 01/31/2024 in [facility] Video Visit from 09/20/2023 in [facility] Video Visit from 07/05/2023 in [facility] Video Visit from 01/24/2023 in [facility]  PHQ-2 Total Score 2 0 2 4 2   PHQ-9 Total Score 10 0 4 11 4    Flowsheet Row Video Visit from 05/01/2024 in [facility] ED from 07/18/2023 in [facility] Emergency Department at [facility] Video Visit from 11/26/2020 in [facility]  C-SSRS RISK CATEGORY No Risk No Risk No Risk     Assessment and Plan: ***  ***  Collaboration of Care: Collaboration of Care: Medication Management AEB provider managing patient's psychiatric medication and Psychiatrist AEB patient being followed by mental health provider at this facility  Patient/Guardian was advised Release of Information must be obtained prior to any record release in order to collaborate their care with an outside provider. Patient/Guardian was advised if they have not already done so to contact the registration department to sign all necessary forms in order  for us  to release information regarding their care.   Consent: Patient/Guardian gives verbal consent for treatment and assignment of benefits for services provided during this visit. Patient/Guardian expressed understanding and agreed to proceed.   1. Generalized anxiety disorder *** - mirtazapine  (REMERON ) 30 MG tablet; Take 1 tablet (30 mg total) by mouth at bedtime.  Dispense: 30 tablet; Refill: 3 - hydrOXYzine  (ATARAX ) 50 MG tablet; Take 1 tablet (50 mg total) by mouth 3 (three) times daily as needed for anxiety (may take 2 tabs at bedtime).  Dispense: 90 tablet; Refill: 3  2. Bipolar I disorder, most recent episode depressed (HCC) *** - QUEtiapine  (SEROQUEL ) 400 MG tablet; Take 1 tablet (400 mg total) by mouth at bedtime.  Dispense: 30 tablet; Refill: 3  Patient to follow-up in 3 months Provider spent a total of 31 minutes with the patient/reviewing patient's chart  Reginia FORBES Bolster, PA 05/01/2024, 5:57 PM

## 2024-05-02 ENCOUNTER — Other Ambulatory Visit: Payer: Self-pay

## 2024-05-02 DIAGNOSIS — F411 Generalized anxiety disorder: Secondary | ICD-10-CM | POA: Insufficient documentation

## 2024-05-02 DIAGNOSIS — Z79899 Other long term (current) drug therapy: Secondary | ICD-10-CM | POA: Insufficient documentation

## 2024-05-08 ENCOUNTER — Other Ambulatory Visit: Payer: Self-pay

## 2024-05-10 ENCOUNTER — Other Ambulatory Visit: Payer: Self-pay

## 2024-07-11 ENCOUNTER — Telehealth (HOSPITAL_COMMUNITY): Payer: Self-pay | Admitting: Psychiatry

## 2024-07-11 NOTE — Telephone Encounter (Signed)
 Patient says he is going through something (he would not be specific) and would like to speak with either Dr. Harl or Paige as soon as possible. Patient was tearful and upset and only wanted to speak with Dr. Harl or Carlyon  [phone]

## 2024-07-16 NOTE — Telephone Encounter (Signed)
 Provider attempted to call patient without success.  Voicemail left for patient to call back and reschedule appointment.

## 2024-07-18 NOTE — Telephone Encounter (Signed)
 Reached pt to let him know his appt on 07-19-24 at 11am needs to be moved. The provider is not here. Tried getting the pt in at a different location today 07-18-24 @ 2:7m but the pt has to pick up his daughter. Pt is coming in to this location on 07-22-24 @ 11am.

## 2024-09-10 ENCOUNTER — Encounter (HOSPITAL_COMMUNITY): Payer: Self-pay | Admitting: Psychiatry

## 2024-09-10 ENCOUNTER — Telehealth (INDEPENDENT_AMBULATORY_CARE_PROVIDER_SITE_OTHER): Admitting: Psychiatry

## 2024-09-10 DIAGNOSIS — F4321 Adjustment disorder with depressed mood: Secondary | ICD-10-CM

## 2024-09-10 DIAGNOSIS — F319 Bipolar disorder, unspecified: Secondary | ICD-10-CM

## 2024-09-10 DIAGNOSIS — F411 Generalized anxiety disorder: Secondary | ICD-10-CM

## 2024-09-10 DIAGNOSIS — F3130 Bipolar disorder, current episode depressed, mild or moderate severity, unspecified: Secondary | ICD-10-CM

## 2024-09-10 MED ORDER — HYDROXYZINE HCL 50 MG PO TABS: 50.0000 mg | ORAL_TABLET | Freq: Three times a day (TID) | ORAL | 3 refills | Status: AC | PRN

## 2024-09-10 MED ORDER — QUETIAPINE FUMARATE 400 MG PO TABS: 400.0000 mg | ORAL_TABLET | Freq: Every day | ORAL | 3 refills | Status: AC

## 2024-09-10 MED ORDER — MIRTAZAPINE 30 MG PO TABS: 30.0000 mg | ORAL_TABLET | Freq: Every day | ORAL | 3 refills | Status: AC

## 2024-09-10 MED ORDER — PAROXETINE HCL 40 MG PO TABS: 40.0000 mg | ORAL_TABLET | Freq: Every day | ORAL | 3 refills | Status: AC

## 2024-09-10 NOTE — Progress Notes (Signed)
 BH MD/PA/NP OP Progress Note Virtual Visit via Video Note  I connected with Kenneth Jimenez on 09/10/2024 at  2:00 PM EST by a video enabled telemedicine application and verified that I am speaking with the correct person using two identifiers.  Location: Patient: Home Provider: Clinic   I discussed the limitations of evaluation and management by telemedicine and the availability of in person appointments. The patient expressed understanding and agreed to proceed.  I provided 30 minutes of non-face-to-face time during this encounter.        09/10/2024 2:42 PM Kenneth Jimenez  MRN:  [id number]  Chief Complaint: My daughter died today   HPI: 58 year old male seen today for follow up psychiatric evaluation.   He has a psychiatric history of bipolar disorder, PTSD, explosive disorder, depression, and cannabis use disorder.  Patient is currently prescribed  mirtazapine  30 mg at bedtime,  hydroxyzine  50 mg 3 times daily as needed, Paxil  40 mg daily, and Seroquel  400 HS. Patient notes that his medications are effective in managing his psychiatric conditions.    Today patient well groomed, pleasant, cooperative, and engaged in conversation. Patient tearful during the exam. He notes that his daughter passed away today. He notes that he has been crying non stop and have not been eating. He reports that he is relying on Jesus to help him get through this. He notes that he is trying to grieve alone. He reports that he trys to walk to cope.Patient notes that he has been offered counseling from the department of social services and is considering going.   Patient notes that the above worsen his anxiety and depression. At this time he is not  able to do a GAD 7 or PHQ 9. Theses assessments can be done at his next visit. Today he denies SI/HI/VAH, mania, or paranoia.  No medication changes made today.  Patient agreeable to continue medication prescribed.  No other concerns at this time.    Visit  Diagnosis:    ICD-10-CM   1. Grief  F43.21     2. Generalized anxiety disorder  F41.1 mirtazapine  (REMERON ) 30 MG tablet    PARoxetine  (PAXIL ) 40 MG tablet    hydrOXYzine  (ATARAX ) 50 MG tablet    3. Bipolar I disorder, most recent episode depressed (HCC)  F31.30 QUEtiapine  (SEROQUEL ) 400 MG tablet             Past Psychiatric History:  bipolar disorder, PTSD, explosive disorder, depression, and cannabis use disorder  Past Medical History:  Past Medical History:  Diagnosis Date   Anorexia    Atrial fib/flutter, transient (HCC)    Back pain    Bipolar 1 disorder (HCC)    Bipolar disorder (HCC)    Bradycardia    Chest pain    Coronary artery disease    Hyperthyroidism    Palpitations    Seizures (HCC)    Tachyarrhythmia    No past surgical history on file.  Family Psychiatric History: Daughter bipolar, intermitted explosive disorder, PTSD, ADHD, and autism, daughter ADHD   Family History:  Family History  Problem Relation Age of Onset   Heart disease Mother    Diabetes Mother    Heart failure Mother    Heart disease Father    Heart attack Father     Social History:  Social History   Socioeconomic History   Marital status: Legally Separated    Spouse name: Not on file   Number of children: Not on file   Years of education:  Not on file   Highest education level: Not on file  Occupational History   Not on file  Tobacco Use   Smoking status: Every Day    Current packs/day: 0.25    Types: Cigarettes   Smokeless tobacco: Current  Substance and Sexual Activity   Alcohol use: No   Drug use: Yes    Types: Marijuana   Sexual activity: Yes  Other Topics Concern   Not on file  Social History Narrative   Currently working to get medicaid.    Social Drivers of Health   Tobacco Use: High Risk (05/01/2024)   Patient History    Smoking Tobacco Use: Every Day    Smokeless Tobacco Use: Current    Passive Exposure: Not on file  Financial Resource Strain: Not  on file  Food Insecurity: Not on file  Transportation Needs: Not on file  Physical Activity: Not on file  Stress: Not on file  Social Connections: Not on file  Depression (EYV7-0): Medium Risk (05/01/2024)   Depression (PHQ2-9)    PHQ-2 Score: 10  Alcohol Screen: Not on file  Housing: Not on file  Utilities: Not on file  Health Literacy: Not on file    Allergies:  Allergies  Allergen Reactions   Penicillins Hives    Metabolic Disorder Labs: No results found for: HGBA1C, MPG No results found for: PROLACTIN No results found for: CHOL, TRIG, HDL, CHOLHDL, VLDL, LDLCALC Lab Results  Component Value Date   TSH 0.381 02/06/2013   TSH 0.376 03/21/2011    Therapeutic Level Labs: No results found for: LITHIUM Lab Results  Component Value Date   VALPROATE <10.0 (L) 12/22/2014   VALPROATE <10.0 (L) 09/11/2010   No results found for: CBMZ  Current Medications: Current Outpatient Medications  Medication Sig Dispense Refill   chlorhexidine  (PERIDEX ) 0.12 % solution Use as directed 15 mLs in the mouth or throat 2 (two) times daily. Swish and spit.  Do not swallow. (Patient not taking: Reported on 06/18/2020) 120 mL 0   fluconazole  (DIFLUCAN ) 200 MG tablet Take 1 tablet (200 mg total) by mouth daily. (Patient not taking: Reported on 06/18/2020) 7 tablet 0   hydrOXYzine  (ATARAX ) 50 MG tablet Take 1 tablet (50 mg total) by mouth 3 (three) times daily as needed for anxiety (may take 2 tabs at bedtime). 90 tablet 3   mirtazapine  (REMERON ) 30 MG tablet Take 1 tablet (30 mg total) by mouth at bedtime. 30 tablet 3   PARoxetine  (PAXIL ) 40 MG tablet Take 1 tablet (40 mg total) by mouth daily. 30 tablet 3   QUEtiapine  (SEROQUEL ) 400 MG tablet Take 1 tablet (400 mg total) by mouth at bedtime. 30 tablet 3   No current facility-administered medications for this visit.     Musculoskeletal: Strength & Muscle Tone: within normal limits and telehealth visit Gait & Station:  normal, telehealth visit Patient leans: N/A  Psychiatric Specialty Exam: Review of Systems  There were no vitals taken for this visit.There is no height or weight on file to calculate BMI.  General Appearance: Well Groomed  Eye Contact:  Good  Speech:  Clear and Coherent and Normal Rate  Volume:  Normal  Mood:  Euthymic and grieving   Affect:  Appropriate and Congruent  Thought Process:  Coherent, Goal Directed and Linear  Orientation:  Full (Time, Place, and Person)  Thought Content: WDL and Logical   Suicidal Thoughts:  No  Homicidal Thoughts:  No  Memory:  Immediate;   Good Recent;  Good Remote;   Good  Judgement:  Good  Insight:  Good  Psychomotor Activity:  Normal  Concentration:  Concentration: Good and Attention Span: Good  Recall:  Good  Fund of Knowledge: Good  Language: Good  Akathisia:  No  Handed:  Left  AIMS (if indicated): Not done  Assets:  Communication Skills Desire for Improvement Financial Resources/Insurance Housing Intimacy Leisure Time Social Support  ADL's:  Intact  Cognition: WNL  Sleep:  Poor   Screenings: AIMS    Flowsheet Row Video Visit from 05/01/2024 in [facility]  AIMS Total Score 0   GAD-7    Flowsheet Row Video Visit from 05/01/2024 in [facility] Video Visit from 01/31/2024 in [facility] Video Visit from 09/20/2023 in [facility] Video Visit from 07/05/2023 in [facility] Video Visit from 01/24/2023 in [facility]  Total GAD-7 Score 5 0 5 12 10    PHQ2-9    Flowsheet Row Video Visit from 05/01/2024 in [facility] Video Visit from 01/31/2024 in [facility] Video Visit from 09/20/2023 in [facility] Video Visit from 07/05/2023 in [facility] Video Visit  from 01/24/2023 in [facility]  PHQ-2 Total Score 2 0 2 4 2   PHQ-9 Total Score 10 0 4 11 4    Flowsheet Row Video Visit from 05/01/2024 in [facility] ED from 07/18/2023 in [facility] Emergency Department at [facility] Video Visit from 11/26/2020 in [facility]  C-SSRS RISK CATEGORY No Risk No Risk No Risk     Assessment and Plan: Patient notes that he is grieving the loss of his daughter. He reports that his sleep and appetite is poor. At this time no medication changes made today.  Patient agreeable to continue medication prescribed.  He will follow up with counseling for therapy.    1. Generalized anxiety disorder  Continue- hydrOXYzine  (ATARAX ) 50 MG tablet; Take 1 tablet (50 mg total) by mouth 3 (three) times daily as needed for anxiety (may take 2 tabs at bedtime).  Dispense: 90 tablet; Refill: 3 Continue- mirtazapine  (REMERON ) 30 MG tablet; Take 1 tablet (30 mg total) by mouth at bedtime.  Dispense: 30 tablet; Refill: 3 Continue- PARoxetine  (PAXIL ) 40 MG tablet; Take 1 tablet (40 mg total) by mouth daily.  Dispense: 30 tablet; Refill: 3  2. Bipolar I disorder, most recent episode depressed (HCC)  Continue- QUEtiapine  (SEROQUEL ) 400 MG tablet; Take 1 tablet (400 mg total) by mouth at bedtime.  Dispense: 30 tablet; Refill: 3     Follow up in 2.5 months Follow up with outpatient therapist.   Zane FORBES Bach, NP 09/10/2024, 2:42 PM

## 2024-11-21 ENCOUNTER — Telehealth (HOSPITAL_COMMUNITY): Admitting: Psychiatry
